# Patient Record
Sex: Female | Born: 1960 | Race: Black or African American | Hispanic: No | Marital: Married | Smoking: Former smoker
Health system: Southern US, Community
[De-identification: ages and names within clinical notes are randomized; demographics above are authoritative.]

## PROBLEM LIST (undated history)

## (undated) ENCOUNTER — Emergency Department (HOSPITAL_BASED_OUTPATIENT_CLINIC_OR_DEPARTMENT_OTHER): Payer: Self-pay | Source: Home / Self Care

## (undated) DIAGNOSIS — M1990 Unspecified osteoarthritis, unspecified site: Secondary | ICD-10-CM

## (undated) DIAGNOSIS — K219 Gastro-esophageal reflux disease without esophagitis: Secondary | ICD-10-CM

## (undated) DIAGNOSIS — J189 Pneumonia, unspecified organism: Secondary | ICD-10-CM

## (undated) DIAGNOSIS — I10 Essential (primary) hypertension: Secondary | ICD-10-CM

## (undated) DIAGNOSIS — M3500 Sicca syndrome, unspecified: Secondary | ICD-10-CM

## (undated) DIAGNOSIS — R0600 Dyspnea, unspecified: Secondary | ICD-10-CM

## (undated) HISTORY — PX: HAMMER TOE SURGERY: SHX385

## (undated) HISTORY — PX: ABDOMINAL HYSTERECTOMY: SHX81

## (undated) HISTORY — DX: Sjogren syndrome, unspecified: M35.00

## (undated) HISTORY — PX: BACK SURGERY: SHX140

## (undated) HISTORY — PX: BUNIONECTOMY: SHX129

## (undated) HISTORY — PX: SALPINGECTOMY: SHX328

## (undated) HISTORY — DX: Gastro-esophageal reflux disease without esophagitis: K21.9

## (undated) HISTORY — PX: ORTHOPEDIC SURGERY: SHX850

## (undated) HISTORY — DX: Unspecified osteoarthritis, unspecified site: M19.90

---
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
## 2005-09-05 ENCOUNTER — Encounter: Admission: RE | Admit: 2005-09-05 | Discharge: 2005-09-05 | Payer: Self-pay | Admitting: Internal Medicine

## 2010-11-18 ENCOUNTER — Emergency Department (HOSPITAL_BASED_OUTPATIENT_CLINIC_OR_DEPARTMENT_OTHER)
Admission: EM | Admit: 2010-11-18 | Discharge: 2010-11-18 | Disposition: A | Discharge status: Home / Self Care | Payer: PRIVATE HEALTH INSURANCE | Attending: Emergency Medicine | Admitting: Emergency Medicine

## 2010-11-18 ENCOUNTER — Emergency Department (INDEPENDENT_AMBULATORY_CARE_PROVIDER_SITE_OTHER): Payer: PRIVATE HEALTH INSURANCE

## 2010-11-18 DIAGNOSIS — I517 Cardiomegaly: Secondary | ICD-10-CM | POA: Insufficient documentation

## 2010-11-18 DIAGNOSIS — Z79899 Other long term (current) drug therapy: Secondary | ICD-10-CM | POA: Insufficient documentation

## 2010-11-18 DIAGNOSIS — R079 Chest pain, unspecified: Secondary | ICD-10-CM

## 2010-11-18 DIAGNOSIS — I10 Essential (primary) hypertension: Secondary | ICD-10-CM | POA: Insufficient documentation

## 2010-11-18 DIAGNOSIS — K219 Gastro-esophageal reflux disease without esophagitis: Secondary | ICD-10-CM | POA: Insufficient documentation

## 2010-11-18 LAB — BASIC METABOLIC PANEL
BUN: 22 mg/dL (ref 6–23)
CO2: 27 mEq/L (ref 19–32)
Calcium: 9.8 mg/dL (ref 8.4–10.5)
Chloride: 104 mEq/L (ref 96–112)
Creatinine, Ser: 0.7 mg/dL (ref 0.4–1.2)
GFR calc Af Amer: >60 mL/min (ref >60)
GFR calc non Af Amer: >60 mL/min (ref >60)
Glucose, Bld: 91 mg/dL (ref 70–99)
Potassium: 3.9 mEq/L (ref 3.5–5.1)
Sodium: 142 mEq/L (ref 135–145)

## 2010-11-18 LAB — DIFFERENTIAL
Basophils Absolute: 0 10*3/uL (ref 0.0–0.1)
Basophils Relative: 0 % (ref 0–1)
Eosinophils Absolute: 0.1 10*3/uL (ref 0.0–0.7)
Eosinophils Relative: 1 % (ref 0–5)
Lymphocytes Relative: 46 % (ref 12–46)
Lymphs Abs: 3.3 10*3/uL (ref 0.7–4.0)
Monocytes Absolute: 0.6 10*3/uL (ref 0.1–1.0)
Monocytes Relative: 9 % (ref 3–12)
Neutro Abs: 3.2 10*3/uL (ref 1.7–7.7)
Neutrophils Relative %: 44 % (ref 43–77)

## 2010-11-18 LAB — POCT CARDIAC MARKERS
CKMB, poc: <1 ng/mL — ABNORMAL LOW (ref 1.0–8.0)
Myoglobin, poc: 39 ng/mL (ref 12–200)
Troponin i, poc: <0.05 ng/mL (ref 0.00–0.09)

## 2010-11-18 LAB — CBC
HCT: 37.1 % (ref 36.0–46.0)
Hemoglobin: 12.4 g/dL (ref 12.0–15.0)
MCH: 23.3 pg — ABNORMAL LOW (ref 26.0–34.0)
MCHC: 33.4 g/dL (ref 30.0–36.0)
MCV: 69.7 fL — ABNORMAL LOW (ref 78.0–100.0)
Platelets: 244 10*3/uL (ref 150–400)
RBC: 5.32 MIL/uL — ABNORMAL HIGH (ref 3.87–5.11)
RDW: 14.6 % (ref 11.5–15.5)
WBC: 7.2 10*3/uL (ref 4.0–10.5)

## 2014-01-10 ENCOUNTER — Encounter (HOSPITAL_BASED_OUTPATIENT_CLINIC_OR_DEPARTMENT_OTHER): Payer: Self-pay | Admitting: Emergency Medicine

## 2014-01-10 DIAGNOSIS — R05 Cough: Secondary | ICD-10-CM | POA: Insufficient documentation

## 2014-01-10 DIAGNOSIS — R059 Cough, unspecified: Secondary | ICD-10-CM | POA: Insufficient documentation

## 2014-01-10 DIAGNOSIS — Z87891 Personal history of nicotine dependence: Secondary | ICD-10-CM | POA: Insufficient documentation

## 2014-01-10 DIAGNOSIS — I10 Essential (primary) hypertension: Secondary | ICD-10-CM | POA: Insufficient documentation

## 2014-01-10 DIAGNOSIS — Z79899 Other long term (current) drug therapy: Secondary | ICD-10-CM | POA: Insufficient documentation

## 2014-01-10 NOTE — ED Notes (Signed)
States noticed a small area of swellinf rt side of neck    No pain

## 2014-01-11 ENCOUNTER — Emergency Department (HOSPITAL_BASED_OUTPATIENT_CLINIC_OR_DEPARTMENT_OTHER): Payer: PRIVATE HEALTH INSURANCE

## 2014-01-11 ENCOUNTER — Emergency Department (HOSPITAL_BASED_OUTPATIENT_CLINIC_OR_DEPARTMENT_OTHER)
Admission: EM | Admit: 2014-01-11 | Discharge: 2014-01-11 | Disposition: A | Discharge status: Home / Self Care | Payer: PRIVATE HEALTH INSURANCE | Attending: Emergency Medicine | Admitting: Emergency Medicine

## 2014-01-11 DIAGNOSIS — I10 Essential (primary) hypertension: Secondary | ICD-10-CM

## 2014-01-11 HISTORY — DX: Essential (primary) hypertension: I10

## 2014-01-11 NOTE — ED Provider Notes (Signed)
CSN: [id number]     Arrival date & time 01/10/14  2328 History   First MD Initiated Contact with Patient 01/11/14 0251     Chief Complaint  Patient presents with  . Facial Swelling     (Consider location/radiation/quality/duration/timing/severity/associated sxs/prior Treatment) HPI This is a 53 year old female who noticed swelling of the right anterolateral neck yesterday. She has never noticed this before. The swelling is worse with Valsalva maneuver. There is no pain or tenderness associated with it. She has had no chest pain or shortness of breath. She has a chronic cough for as long as she can remember, in which she attributes to postnasal drip. She has a history of ankle edema for which she has been treated with Lasix in the past. She is currently on hydrochlorothiazide for hypertension.  Past Medical History  Diagnosis Date  . Hypertension    Past Surgical History  Procedure Laterality Date  . Orthopedic surgery    . Abdominal hysterectomy     No family history on file. History  Substance Use Topics  . Smoking status: Former Games developer  . Smokeless tobacco: Not on file  . Alcohol Use: Yes   OB History   Grav Para Term Preterm Abortions TAB SAB Ect Mult Living                 Review of Systems  All other systems reviewed and are negative.  Allergies  Sulfa antibiotics and Tetracyclines & related  Home Medications   Current Outpatient Rx  Name  Route  Sig  Dispense  Refill  . hydrochlorothiazide (HYDRODIURIL) 25 MG tablet   Oral   Take 25 mg by mouth daily.          BP 170/117  Pulse 66  Temp(Src) 98.2 F (36.8 C) (Oral)  Resp 22  Ht 5' 2.75" (1.594 m)  Wt 230 lb (104.327 kg)  BMI 41.06 kg/m2  SpO2 98%  Physical Exam General: Well-developed, well-nourished female in no acute distress; appearance consistent with age of record HENT: normocephalic; atraumatic; normal pharynx Eyes: pupils equal, round and reactive to light; extraocular muscles intact Neck:  supple; nontender; jugular venous pulsations noted, more prominent with Valsalva maneuver Heart: regular rate and rhythm; no murmurs, rubs or gallops Lungs: clear to auscultation bilaterally Abdomen: soft; nondistended; mild right upper quadrant tenderness; no masses or hepatosplenomegaly; bowel sounds present; no hepatojugular reflex Extremities: No deformity; full range of motion; pulses normal; trace edema of ankles Neurologic: Awake, alert and oriented; motor function intact in all extremities and symmetric; no facial droop Skin: Warm and dry Psychiatric: Normal mood and affect    ED Course  Procedures (including critical care time)    MDM  Nursing notes and vitals signs, including pulse oximetry, reviewed.  Summary of this visit's results, reviewed by myself:  Labs:  The patient did not wish to wait for lab results and will followup with her primary care physician for followup evaluation.  Imaging Studies: Dg Chest 2 View  01/11/2014   CLINICAL DATA:  Cough  EXAM: CHEST  2 VIEW  COMPARISON:  11/18/2010  FINDINGS: Chronic mild cardiomegaly. Unchanged aortic contours. No edema or consolidation. No effusion or pneumothorax. Negative osseous structures.  IMPRESSION: No active cardiopulmonary disease.   Electronically Signed   By: Tiburcio Pea M.D.   On: 01/11/2014 04:08   No evidence of right-sided heart failure was found on exam or x-ray. The patient's blood pressure which was initially 170/117 has improved. She was advised of her  blood pressure and she will follow up with her primary care physician.    Hanley SeamenJohn L Damiana Berrian, MD 01/11/14 [phone]

## 2014-01-11 NOTE — Discharge Instructions (Signed)

## 2014-01-11 NOTE — ED Notes (Signed)
[facility] [phone]) Med rec was faxed. Unable to scan med rec req due to no scan access

## 2014-01-11 NOTE — ED Notes (Signed)
MD at bedside. 

## 2014-01-11 NOTE — ED Notes (Signed)
Patient transported to X-ray 

## 2014-01-11 NOTE — ED Notes (Signed)
Pt states that she noted her right side of her neck was swollen this afternoon and decided to have it evaluated, denies sob, denies difficulty swallowing. + cough secondary to allergies, pt did state that she was on a "ccb" in the past but it was discontinued secondary to a chronic cough. The cough cleared up once med was stopped. Pt stated that she is now on an "arb" for htn now and has had not difficulties. No edema noted to visible eye.

## 2016-02-27 DIAGNOSIS — E559 Vitamin D deficiency, unspecified: Secondary | ICD-10-CM

## 2016-02-27 HISTORY — DX: Vitamin D deficiency, unspecified: E55.9

## 2018-01-10 DIAGNOSIS — M5416 Radiculopathy, lumbar region: Secondary | ICD-10-CM | POA: Insufficient documentation

## 2018-01-10 HISTORY — DX: Radiculopathy, lumbar region: M54.16

## 2018-08-02 DIAGNOSIS — F4323 Adjustment disorder with mixed anxiety and depressed mood: Secondary | ICD-10-CM | POA: Insufficient documentation

## 2018-08-02 HISTORY — DX: Adjustment disorder with mixed anxiety and depressed mood: F43.23

## 2019-05-18 DIAGNOSIS — I272 Pulmonary hypertension, unspecified: Secondary | ICD-10-CM | POA: Insufficient documentation

## 2019-05-18 DIAGNOSIS — G250 Essential tremor: Secondary | ICD-10-CM | POA: Insufficient documentation

## 2019-05-18 DIAGNOSIS — R718 Other abnormality of red blood cells: Secondary | ICD-10-CM | POA: Insufficient documentation

## 2019-05-18 HISTORY — DX: Other abnormality of red blood cells: R71.8

## 2019-05-18 HISTORY — DX: Essential tremor: G25.0

## 2019-05-18 HISTORY — DX: Pulmonary hypertension, unspecified: I27.20

## 2019-07-16 DIAGNOSIS — M542 Cervicalgia: Secondary | ICD-10-CM

## 2019-07-16 DIAGNOSIS — M48061 Spinal stenosis, lumbar region without neurogenic claudication: Secondary | ICD-10-CM

## 2019-07-16 HISTORY — DX: Cervicalgia: M54.2

## 2019-07-16 HISTORY — DX: Spinal stenosis, lumbar region without neurogenic claudication: M48.061

## 2019-07-18 DIAGNOSIS — K219 Gastro-esophageal reflux disease without esophagitis: Secondary | ICD-10-CM

## 2019-07-18 HISTORY — DX: Gastro-esophageal reflux disease without esophagitis: K21.9

## 2020-05-22 DIAGNOSIS — Z9189 Other specified personal risk factors, not elsewhere classified: Secondary | ICD-10-CM

## 2020-05-22 DIAGNOSIS — M3500 Sicca syndrome, unspecified: Secondary | ICD-10-CM | POA: Insufficient documentation

## 2020-05-22 HISTORY — DX: Other specified personal risk factors, not elsewhere classified: Z91.89

## 2020-06-01 ENCOUNTER — Encounter (HOSPITAL_BASED_OUTPATIENT_CLINIC_OR_DEPARTMENT_OTHER): Payer: Self-pay | Admitting: Emergency Medicine

## 2020-06-01 ENCOUNTER — Other Ambulatory Visit: Payer: Self-pay

## 2020-06-01 ENCOUNTER — Inpatient Hospital Stay (HOSPITAL_BASED_OUTPATIENT_CLINIC_OR_DEPARTMENT_OTHER)
Admission: EM | Admit: 2020-06-01 | Discharge: 2020-06-05 | DRG: 917 | Disposition: A | Discharge status: Home / Self Care | Payer: BC Managed Care – PPO | Attending: Student | Admitting: Student

## 2020-06-01 ENCOUNTER — Emergency Department (HOSPITAL_BASED_OUTPATIENT_CLINIC_OR_DEPARTMENT_OTHER): Payer: BC Managed Care – PPO

## 2020-06-01 DIAGNOSIS — F1210 Cannabis abuse, uncomplicated: Secondary | ICD-10-CM | POA: Diagnosis present

## 2020-06-01 DIAGNOSIS — Z882 Allergy status to sulfonamides status: Secondary | ICD-10-CM

## 2020-06-01 DIAGNOSIS — F419 Anxiety disorder, unspecified: Secondary | ICD-10-CM | POA: Diagnosis present

## 2020-06-01 DIAGNOSIS — Z79899 Other long term (current) drug therapy: Secondary | ICD-10-CM

## 2020-06-01 DIAGNOSIS — E876 Hypokalemia: Secondary | ICD-10-CM | POA: Diagnosis not present

## 2020-06-01 DIAGNOSIS — G834 Cauda equina syndrome: Secondary | ICD-10-CM | POA: Diagnosis present

## 2020-06-01 DIAGNOSIS — F329 Major depressive disorder, single episode, unspecified: Secondary | ICD-10-CM | POA: Diagnosis present

## 2020-06-01 DIAGNOSIS — G9341 Metabolic encephalopathy: Secondary | ICD-10-CM | POA: Diagnosis present

## 2020-06-01 DIAGNOSIS — T407X1A Poisoning by cannabis (derivatives), accidental (unintentional), initial encounter: Secondary | ICD-10-CM | POA: Diagnosis not present

## 2020-06-01 DIAGNOSIS — Z6832 Body mass index (BMI) 32.0-32.9, adult: Secondary | ICD-10-CM

## 2020-06-01 DIAGNOSIS — Z881 Allergy status to other antibiotic agents status: Secondary | ICD-10-CM

## 2020-06-01 DIAGNOSIS — G9340 Encephalopathy, unspecified: Secondary | ICD-10-CM

## 2020-06-01 DIAGNOSIS — Z87891 Personal history of nicotine dependence: Secondary | ICD-10-CM

## 2020-06-01 DIAGNOSIS — R112 Nausea with vomiting, unspecified: Secondary | ICD-10-CM

## 2020-06-01 DIAGNOSIS — E871 Hypo-osmolality and hyponatremia: Secondary | ICD-10-CM | POA: Diagnosis present

## 2020-06-01 DIAGNOSIS — R1110 Vomiting, unspecified: Secondary | ICD-10-CM

## 2020-06-01 DIAGNOSIS — T502X5A Adverse effect of carbonic-anhydrase inhibitors, benzothiadiazides and other diuretics, initial encounter: Secondary | ICD-10-CM | POA: Diagnosis present

## 2020-06-01 DIAGNOSIS — E669 Obesity, unspecified: Secondary | ICD-10-CM | POA: Diagnosis present

## 2020-06-01 DIAGNOSIS — Z20822 Contact with and (suspected) exposure to covid-19: Secondary | ICD-10-CM | POA: Diagnosis present

## 2020-06-01 DIAGNOSIS — Z90710 Acquired absence of both cervix and uterus: Secondary | ICD-10-CM

## 2020-06-01 DIAGNOSIS — I10 Essential (primary) hypertension: Secondary | ICD-10-CM | POA: Diagnosis present

## 2020-06-01 DIAGNOSIS — R9431 Abnormal electrocardiogram [ECG] [EKG]: Secondary | ICD-10-CM | POA: Diagnosis present

## 2020-06-01 DIAGNOSIS — G92 Toxic encephalopathy: Secondary | ICD-10-CM | POA: Diagnosis present

## 2020-06-01 HISTORY — DX: Encephalopathy, unspecified: G93.40

## 2020-06-01 LAB — URINALYSIS, ROUTINE W REFLEX MICROSCOPIC
Bilirubin Urine: NEGATIVE
Glucose, UA: NEGATIVE mg/dL
Ketones, ur: 40 mg/dL — AB
Leukocytes,Ua: NEGATIVE
Nitrite: NEGATIVE
Protein, ur: 100 mg/dL — AB
Specific Gravity, Urine: 1.025 (ref 1.005–1.030)
pH: 7.5 (ref 5.0–8.0)

## 2020-06-01 LAB — URINALYSIS, MICROSCOPIC (REFLEX): Bacteria, UA: NONE SEEN

## 2020-06-01 LAB — COMPREHENSIVE METABOLIC PANEL
ALT: 18 U/L (ref 0–44)
AST: 29 U/L (ref 15–41)
Albumin: 4.6 g/dL (ref 3.5–5.0)
Alkaline Phosphatase: 83 U/L (ref 38–126)
Anion gap: 16 — ABNORMAL HIGH (ref 5–15)
BUN: 8 mg/dL (ref 6–20)
CO2: 24 mmol/L (ref 22–32)
Calcium: 10.2 mg/dL (ref 8.9–10.3)
Chloride: 97 mmol/L — ABNORMAL LOW (ref 98–111)
Creatinine, Ser: 0.6 mg/dL (ref 0.44–1.00)
GFR calc Af Amer: >60 mL/min (ref >60)
GFR calc non Af Amer: >60 mL/min (ref >60)
Glucose, Bld: 143 mg/dL — ABNORMAL HIGH (ref 70–99)
Potassium: 2.7 mmol/L — CL (ref 3.5–5.1)
Sodium: 137 mmol/L (ref 135–145)
Total Bilirubin: 0.8 mg/dL (ref 0.3–1.2)
Total Protein: 8.3 g/dL — ABNORMAL HIGH (ref 6.5–8.1)

## 2020-06-01 LAB — RAPID URINE DRUG SCREEN, HOSP PERFORMED
Amphetamines: NOT DETECTED
Barbiturates: NOT DETECTED
Benzodiazepines: NOT DETECTED
Cocaine: NOT DETECTED
Opiates: NOT DETECTED
Tetrahydrocannabinol: POSITIVE — AB

## 2020-06-01 LAB — AMMONIA: Ammonia: 23 umol/L (ref 9–35)

## 2020-06-01 LAB — CBC
HCT: 43.8 % (ref 36.0–46.0)
Hemoglobin: 13.8 g/dL (ref 12.0–15.0)
MCH: 23.4 pg — ABNORMAL LOW (ref 26.0–34.0)
MCHC: 31.5 g/dL (ref 30.0–36.0)
MCV: 74.4 fL — ABNORMAL LOW (ref 80.0–100.0)
Platelets: 257 10*3/uL (ref 150–400)
RBC: 5.89 MIL/uL — ABNORMAL HIGH (ref 3.87–5.11)
RDW: 14.6 % (ref 11.5–15.5)
WBC: 8.7 10*3/uL (ref 4.0–10.5)
nRBC: 0 % (ref 0.0–0.2)

## 2020-06-01 LAB — PREGNANCY, URINE: Preg Test, Ur: NEGATIVE

## 2020-06-01 LAB — LIPASE, BLOOD: Lipase: 21 U/L (ref 11–51)

## 2020-06-01 LAB — SARS CORONAVIRUS 2 BY RT PCR (HOSPITAL ORDER, PERFORMED IN ~~LOC~~ HOSPITAL LAB): SARS Coronavirus 2: NEGATIVE

## 2020-06-01 LAB — MAGNESIUM: Magnesium: 1.3 mg/dL — ABNORMAL LOW (ref 1.7–2.4)

## 2020-06-01 MED ORDER — SODIUM CHLORIDE 0.9 % IV SOLN: INTRAVENOUS | Status: DC | PRN

## 2020-06-01 MED ORDER — LORAZEPAM 2 MG/ML IJ SOLN
1.0000 mg | Freq: Once | INTRAMUSCULAR | Status: AC
  Administered 2020-06-01: 1 mg via INTRAVENOUS
  Filled 2020-06-01: qty 1

## 2020-06-01 MED ORDER — MAGNESIUM OXIDE 400 (241.3 MG) MG PO TABS: 400.0000 mg | ORAL_TABLET | Freq: Once | ORAL | Status: DC

## 2020-06-01 MED ORDER — ONDANSETRON HCL 4 MG/2ML IJ SOLN
4.0000 mg | Freq: Once | INTRAMUSCULAR | Status: AC
  Administered 2020-06-01: 4 mg via INTRAVENOUS
  Filled 2020-06-01: qty 2

## 2020-06-01 MED ORDER — SODIUM CHLORIDE 0.9 % IV BOLUS
1000.0000 mL | Freq: Once | INTRAVENOUS | Status: AC
  Administered 2020-06-01: 1000 mL via INTRAVENOUS

## 2020-06-01 MED ORDER — POTASSIUM CHLORIDE 10 MEQ/100ML IV SOLN
10.0000 meq | INTRAVENOUS | Status: AC
  Administered 2020-06-01 (×2): 10 meq via INTRAVENOUS
  Filled 2020-06-01 (×4): qty 100

## 2020-06-01 MED ORDER — MAGNESIUM SULFATE 2 GM/50ML IV SOLN
2.0000 g | Freq: Once | INTRAVENOUS | Status: AC
  Administered 2020-06-01: 2 g via INTRAVENOUS
  Filled 2020-06-01: qty 50

## 2020-06-01 MED ORDER — POTASSIUM CHLORIDE CRYS ER 20 MEQ PO TBCR
40.0000 meq | EXTENDED_RELEASE_TABLET | Freq: Once | ORAL | Status: DC
  Filled 2020-06-01: qty 2

## 2020-06-01 NOTE — ED Notes (Signed)
Pt very restless and talking to herself. Pt able to answer yes/no questions and is oriented to self and place. Pt disoriented to time and events.

## 2020-06-01 NOTE — ED Notes (Signed)
Not ready for xray, RN to call

## 2020-06-01 NOTE — ED Provider Notes (Signed)
[facility] EMERGENCY DEPARTMENT Provider Note   CSN: [id number] Arrival date & time: 06/01/20  1700     History Chief Complaint  Patient presents with  . Abdominal Pain    Mikayla Hart is a 59 y.o. female.  Patient with history of GERD, hypertension, depression presents to the emergency department for nausea, vomiting, and generalized abdominal pain.  She also presents with altered mental status.  Patient had lumbar microdiscectomy performed 2 days ago as outpatient surgery at [facility] ED.  She tolerated this procedure well.  Per husband she was in normal state of health yesterday.  Around noon today she developed nausea and vomiting.  She used a Phenergan suppository.  About 2 PM she started "talking out of her head".  She was confused, restless, talking to herself.  She was saying things that they make any sense.  She had multiple episodes of vomiting.  Patient was prescribed pain medication after surgery but states that she has not taken any.  She denies alcohol or other drugs to me.  No previous abdominal surgeries.  She was transported here by EMS.  Patient denies signs of stroke including: facial droop, slurred speech, aphasia, weakness/numbness in extremities, imbalance/trouble walking.         Past Medical History:  Diagnosis Date  . Hypertension     There are no problems to display for this patient.   Past Surgical History:  Procedure Laterality Date  . ABDOMINAL HYSTERECTOMY    . ORTHOPEDIC SURGERY       OB History   No obstetric history on file.     No family history on file.  Social History   Tobacco Use  . Smoking status: Former Smoker  Substance Use Topics  . Alcohol use: Yes  . Drug use: No    Home Medications Prior to Admission medications   Medication Sig Start Date End Date Taking? Authorizing Provider  hydrochlorothiazide (HYDRODIURIL) 25 MG tablet Take 25 mg by mouth daily.    [provider]    Allergies    Sulfa  antibiotics and Tetracyclines & related  Review of Systems   Review of Systems  Constitutional: Negative for chills and fever.  HENT: Negative for rhinorrhea and sore throat.   Eyes: Negative for redness.  Respiratory: Negative for cough and shortness of breath.   Cardiovascular: Negative for chest pain.  Gastrointestinal: Positive for abdominal pain, nausea and vomiting. Negative for diarrhea.  Genitourinary: Negative for dysuria, frequency, hematuria and urgency.  Musculoskeletal: Negative for back pain and myalgias.  Skin: Negative for rash.  Neurological: Negative for headaches.  Psychiatric/Behavioral: Positive for confusion.    Physical Exam Updated Vital Signs BP (!) 149/101 (BP Location: Right Arm)   Pulse 69   Temp 98 F (36.7 C) (Oral)   Resp 18   Ht 5\' 2"  (1.575 m)   Wt 81.6 kg   SpO2 100%   BMI 32.92 kg/m   Physical Exam Vitals and nursing note reviewed.  Constitutional:      General: She is not in acute distress.    Appearance: She is well-developed.  HENT:     Head: Normocephalic and atraumatic.     Right Ear: Tympanic membrane, ear canal and external ear normal.     Left Ear: Tympanic membrane, ear canal and external ear normal.     Nose: Nose normal.     Mouth/Throat:     Pharynx: Uvula midline.  Eyes:     General: Lids are normal.  Extraocular Movements:     Right eye: No nystagmus.     Left eye: No nystagmus.     Conjunctiva/sclera: Conjunctivae normal.     Pupils: Pupils are equal, round, and reactive to light.  Cardiovascular:     Rate and Rhythm: Normal rate and regular rhythm.     Heart sounds: No murmur heard.   Pulmonary:     Effort: Pulmonary effort is normal. No respiratory distress.     Breath sounds: Normal breath sounds. No wheezing, rhonchi or rales.  Abdominal:     Palpations: Abdomen is soft.     Tenderness: There is no abdominal tenderness. There is no guarding or rebound.  Musculoskeletal:     Cervical back: Normal range  of motion and neck supple. No tenderness or bony tenderness.     Right lower leg: No edema.     Left lower leg: No edema.     Comments: Surgical wound evaluated.  Wound is clean and dry without surrounding erythema.  No drainage.  Wound is closed with tissue adhesive.  Skin:    General: Skin is warm and dry.     Findings: No rash.  Neurological:     General: No focal deficit present.     Mental Status: She is alert. Mental status is at baseline. She is disoriented.     GCS: GCS eye subscore is 4. GCS verbal subscore is 5. GCS motor subscore is 6.     Cranial Nerves: No cranial nerve deficit.     Sensory: No sensory deficit.     Motor: No weakness.     Coordination: Coordination normal.     Gait: Gait normal.     Comments: Follows basic commands. Cannot tell me where she had her surgery done. She has rambling speech at times which doesn't make sense.  Psychiatric:        Mood and Affect: Mood normal.     ED Results / Procedures / Treatments   Labs (all labs ordered are listed, but only abnormal results are displayed) Labs Reviewed  COMPREHENSIVE METABOLIC PANEL - Abnormal; Notable for the following components:      Result Value   Potassium 2.7 (*)    Chloride 97 (*)    Glucose, Bld 143 (*)    Total Protein 8.3 (*)    Anion gap 16 (*)    All other components within normal limits  CBC - Abnormal; Notable for the following components:   RBC 5.89 (*)    MCV 74.4 (*)    MCH 23.4 (*)    All other components within normal limits  URINALYSIS, ROUTINE W REFLEX MICROSCOPIC - Abnormal; Notable for the following components:   Hgb urine dipstick MODERATE (*)    Ketones, ur 40 (*)    Protein, ur 100 (*)    All other components within normal limits  MAGNESIUM - Abnormal; Notable for the following components:   Magnesium 1.3 (*)    All other components within normal limits  RAPID URINE DRUG SCREEN, HOSP PERFORMED - Abnormal; Notable for the following components:   Tetrahydrocannabinol  POSITIVE (*)    All other components within normal limits  SARS CORONAVIRUS 2 BY RT PCR (HOSPITAL ORDER, PERFORMED IN  HOSPITAL LAB)  LIPASE, BLOOD  PREGNANCY, URINE  AMMONIA  URINALYSIS, MICROSCOPIC (REFLEX)    EKG None  Radiology DG Chest 2 View  Result Date: 06/01/2020 CLINICAL DATA:  Generalized abdominal pain and vomiting. EXAM: CHEST - 2 VIEW COMPARISON:  May 11, 2019 FINDINGS: There is no evidence of acute infiltrate, pleural effusion or pneumothorax. The heart size and mediastinal contours are within normal limits. The visualized skeletal structures are unremarkable. IMPRESSION: No active cardiopulmonary disease. Electronically Signed   By: Aram Candelahaddeus  Houston M.D.   On: 06/01/2020 20:19   CT Head Wo Contrast  Result Date: 06/01/2020 CLINICAL DATA:  Mental status change, unknown cause Patient reports back surgery 2 days ago. EXAM: CT HEAD WITHOUT CONTRAST TECHNIQUE: Contiguous axial images were obtained from the base of the skull through the vertex without intravenous contrast. COMPARISON:  Head CT 07/06/2016 FINDINGS: Brain: Brain volume is normal for age. No intracranial hemorrhage, mass effect, or midline shift. No hydrocephalus. The basilar cisterns are patent. No evidence of territorial infarct or acute ischemia. No extra-axial or intracranial fluid collection. Vascular: No hyperdense vessel or unexpected calcification. Skull: No fracture or focal lesion. Sinuses/Orbits: Frontal sinuses are hypo pneumatized. No acute findings. Mastoid air cells are clear. Included orbits are unremarkable. Other: None. IMPRESSION: Negative noncontrast head CT. Electronically Signed   By: Narda RutherfordMelanie  Sanford M.D.   On: 06/01/2020 22:41    Procedures Procedures (including critical care time)  Medications Ordered in ED Medications  potassium chloride SA (KLOR-CON) CR tablet 40 mEq (40 mEq Oral Not Given 06/01/20 1944)  potassium chloride 10 mEq in 100 mL IVPB (10 mEq Intravenous New  Bag/Given 06/01/20 2327)  0.9 %  sodium chloride infusion ( Intravenous New Bag/Given 06/01/20 2033)  magnesium sulfate IVPB 2 g 50 mL (2 g Intravenous New Bag/Given 06/01/20 2338)  0.9 %  sodium chloride infusion (has no administration in time range)  sodium chloride 0.9 % bolus 1,000 mL ( Intravenous Stopped 06/01/20 2053)  ondansetron (ZOFRAN) injection 4 mg (4 mg Intravenous Given 06/01/20 1942)  LORazepam (ATIVAN) injection 1 mg (1 mg Intravenous Given 06/01/20 1941)    ED Course  I have reviewed the triage vital signs and the nursing notes.  Pertinent labs & imaging results that were available during my care of the patient were reviewed by me and considered in my medical decision making (see chart for details).  Patient seen and examined. Work-up initiated. Medications ordered.    Vital signs reviewed and are as follows: BP (!) 149/101 (BP Location: Right Arm)   Pulse 69   Temp 98 F (36.7 C) (Oral)   Resp 18   Ht 5\' 2"  (1.575 m)   Wt 81.6 kg   SpO2 100%   BMI 32.92 kg/m   K and mag low. Repletion ordered. Pt was given a dose of Ativan earlier due to extreme restlessness.  I went to recheck the patient.  She was sleeping.  She continues to be confused with inappropriate speech.  Husband agrees that she is not back to prior baseline.  Patient discussed with Dr. Particia NearingHaviland.  Head CT ordered.  Head CT normal.  As patient is now back to her baseline and has electrolyte abnormalities, plan to admit to the hospital for observation.  Will speak with hospitalist and request admission.  Patient is Covid negative.  UDS positive for THC.  11:49 PM Spoke with Dr. Antionette Charpyd who accepts for admission.     MDM Rules/Calculators/A&P                          Admit for encephalopathy and electrolyte disturbances.    Final Clinical Impression(s) / ED Diagnoses Final diagnoses:  Hypokalemia  Hypomagnesemia  Non-intractable vomiting with nausea,  unspecified vomiting type  Encephalopathy    Rx  / DC Orders ED Discharge Orders    None       Renne Crigler, Cordelia Poche 06/01/20 2350    Jacalyn Lefevre, MD 06/04/20 [phone]

## 2020-06-01 NOTE — ED Notes (Signed)
Date and time results received: 06/01/20 1757   Test:potassium Critical Value: 2.7  Name of Provider Notified: Haviland Orders Received? Or Actions Taken?:no orders given

## 2020-06-01 NOTE — ED Triage Notes (Signed)
Brought by ems from home.  Reports she had back surgery on Friday.  Was fine the first few days.  Today started having generalized abdominal pain and vomiting.  Used phenergan 12.5mg  supp around 1230 today.  Has not taken any of the pain meds she was prescribed.

## 2020-06-02 ENCOUNTER — Observation Stay (HOSPITAL_COMMUNITY): Payer: BC Managed Care – PPO

## 2020-06-02 ENCOUNTER — Encounter (HOSPITAL_COMMUNITY): Payer: Self-pay | Admitting: Internal Medicine

## 2020-06-02 DIAGNOSIS — E871 Hypo-osmolality and hyponatremia: Secondary | ICD-10-CM | POA: Diagnosis present

## 2020-06-02 DIAGNOSIS — G9340 Encephalopathy, unspecified: Secondary | ICD-10-CM | POA: Diagnosis not present

## 2020-06-02 DIAGNOSIS — G92 Toxic encephalopathy: Secondary | ICD-10-CM | POA: Diagnosis present

## 2020-06-02 DIAGNOSIS — R1110 Vomiting, unspecified: Secondary | ICD-10-CM

## 2020-06-02 DIAGNOSIS — F1210 Cannabis abuse, uncomplicated: Secondary | ICD-10-CM | POA: Diagnosis present

## 2020-06-02 DIAGNOSIS — G9341 Metabolic encephalopathy: Secondary | ICD-10-CM | POA: Diagnosis present

## 2020-06-02 DIAGNOSIS — I10 Essential (primary) hypertension: Secondary | ICD-10-CM | POA: Diagnosis present

## 2020-06-02 DIAGNOSIS — Z20822 Contact with and (suspected) exposure to covid-19: Secondary | ICD-10-CM | POA: Diagnosis present

## 2020-06-02 DIAGNOSIS — Z6832 Body mass index (BMI) 32.0-32.9, adult: Secondary | ICD-10-CM | POA: Diagnosis not present

## 2020-06-02 DIAGNOSIS — Z79899 Other long term (current) drug therapy: Secondary | ICD-10-CM | POA: Diagnosis not present

## 2020-06-02 DIAGNOSIS — Z90710 Acquired absence of both cervix and uterus: Secondary | ICD-10-CM | POA: Diagnosis not present

## 2020-06-02 DIAGNOSIS — E669 Obesity, unspecified: Secondary | ICD-10-CM | POA: Diagnosis present

## 2020-06-02 DIAGNOSIS — R9431 Abnormal electrocardiogram [ECG] [EKG]: Secondary | ICD-10-CM | POA: Diagnosis present

## 2020-06-02 DIAGNOSIS — Z882 Allergy status to sulfonamides status: Secondary | ICD-10-CM | POA: Diagnosis not present

## 2020-06-02 DIAGNOSIS — Z87891 Personal history of nicotine dependence: Secondary | ICD-10-CM | POA: Diagnosis not present

## 2020-06-02 DIAGNOSIS — F419 Anxiety disorder, unspecified: Secondary | ICD-10-CM | POA: Diagnosis present

## 2020-06-02 DIAGNOSIS — E876 Hypokalemia: Secondary | ICD-10-CM

## 2020-06-02 DIAGNOSIS — Z881 Allergy status to other antibiotic agents status: Secondary | ICD-10-CM | POA: Diagnosis not present

## 2020-06-02 DIAGNOSIS — F329 Major depressive disorder, single episode, unspecified: Secondary | ICD-10-CM | POA: Diagnosis present

## 2020-06-02 DIAGNOSIS — G834 Cauda equina syndrome: Secondary | ICD-10-CM | POA: Diagnosis present

## 2020-06-02 DIAGNOSIS — R112 Nausea with vomiting, unspecified: Secondary | ICD-10-CM | POA: Diagnosis present

## 2020-06-02 DIAGNOSIS — T502X5A Adverse effect of carbonic-anhydrase inhibitors, benzothiadiazides and other diuretics, initial encounter: Secondary | ICD-10-CM | POA: Diagnosis present

## 2020-06-02 DIAGNOSIS — T407X1A Poisoning by cannabis (derivatives), accidental (unintentional), initial encounter: Secondary | ICD-10-CM | POA: Diagnosis present

## 2020-06-02 HISTORY — DX: Metabolic encephalopathy: G93.41

## 2020-06-02 HISTORY — DX: Hypokalemia: E87.6

## 2020-06-02 HISTORY — DX: Vomiting, unspecified: R11.10

## 2020-06-02 HISTORY — DX: Essential (primary) hypertension: I10

## 2020-06-02 HISTORY — DX: Hypomagnesemia: E83.42

## 2020-06-02 LAB — BASIC METABOLIC PANEL
Anion gap: 12 (ref 5–15)
BUN: 8 mg/dL (ref 6–20)
CO2: 24 mmol/L (ref 22–32)
Calcium: 9.3 mg/dL (ref 8.9–10.3)
Chloride: 99 mmol/L (ref 98–111)
Creatinine, Ser: 0.61 mg/dL (ref 0.44–1.00)
GFR calc Af Amer: >60 mL/min (ref >60)
GFR calc non Af Amer: >60 mL/min (ref >60)
Glucose, Bld: 104 mg/dL — ABNORMAL HIGH (ref 70–99)
Potassium: 3.1 mmol/L — ABNORMAL LOW (ref 3.5–5.1)
Sodium: 135 mmol/L (ref 135–145)

## 2020-06-02 LAB — CBC WITH DIFFERENTIAL/PLATELET
Abs Immature Granulocytes: 0.05 10*3/uL (ref 0.00–0.07)
Abs Immature Granulocytes: 0.06 10*3/uL (ref 0.00–0.07)
Basophils Absolute: 0 10*3/uL (ref 0.0–0.1)
Basophils Absolute: 0 10*3/uL (ref 0.0–0.1)
Basophils Relative: 0 %
Basophils Relative: 0 %
Eosinophils Absolute: 0 10*3/uL (ref 0.0–0.5)
Eosinophils Absolute: 0 10*3/uL (ref 0.0–0.5)
Eosinophils Relative: 0 %
Eosinophils Relative: 0 %
HCT: 40.4 % (ref 36.0–46.0)
HCT: 41.6 % (ref 36.0–46.0)
Hemoglobin: 13 g/dL (ref 12.0–15.0)
Hemoglobin: 13.6 g/dL (ref 12.0–15.0)
Immature Granulocytes: 1 %
Immature Granulocytes: 1 %
Lymphocytes Relative: 22 %
Lymphocytes Relative: 20 %
Lymphs Abs: 2.3 10*3/uL (ref 0.7–4.0)
Lymphs Abs: 2.1 10*3/uL (ref 0.7–4.0)
MCH: 23.7 pg — ABNORMAL LOW (ref 26.0–34.0)
MCH: 24 pg — ABNORMAL LOW (ref 26.0–34.0)
MCHC: 32.2 g/dL (ref 30.0–36.0)
MCHC: 32.7 g/dL (ref 30.0–36.0)
MCV: 73.7 fL — ABNORMAL LOW (ref 80.0–100.0)
MCV: 73.5 fL — ABNORMAL LOW (ref 80.0–100.0)
Monocytes Absolute: 0.9 10*3/uL (ref 0.1–1.0)
Monocytes Absolute: 0.8 10*3/uL (ref 0.1–1.0)
Monocytes Relative: 9 %
Monocytes Relative: 8 %
Neutro Abs: 7.3 10*3/uL (ref 1.7–7.7)
Neutro Abs: 7.2 10*3/uL (ref 1.7–7.7)
Neutrophils Relative %: 68 %
Neutrophils Relative %: 71 %
Platelets: 252 10*3/uL (ref 150–400)
Platelets: 250 10*3/uL (ref 150–400)
RBC: 5.48 MIL/uL — ABNORMAL HIGH (ref 3.87–5.11)
RBC: 5.66 MIL/uL — ABNORMAL HIGH (ref 3.87–5.11)
RDW: 14.6 % (ref 11.5–15.5)
RDW: 14.5 % (ref 11.5–15.5)
WBC: 10.6 10*3/uL — ABNORMAL HIGH (ref 4.0–10.5)
WBC: 10.1 10*3/uL (ref 4.0–10.5)
nRBC: 0 % (ref 0.0–0.2)
nRBC: 0 % (ref 0.0–0.2)

## 2020-06-02 LAB — COMPREHENSIVE METABOLIC PANEL
ALT: 16 U/L (ref 0–44)
AST: 24 U/L (ref 15–41)
Albumin: 4 g/dL (ref 3.5–5.0)
Alkaline Phosphatase: 75 U/L (ref 38–126)
Anion gap: 11 (ref 5–15)
BUN: 7 mg/dL (ref 6–20)
CO2: 26 mmol/L (ref 22–32)
Calcium: 9.3 mg/dL (ref 8.9–10.3)
Chloride: 98 mmol/L (ref 98–111)
Creatinine, Ser: 0.53 mg/dL (ref 0.44–1.00)
GFR calc Af Amer: >60 mL/min (ref >60)
GFR calc non Af Amer: >60 mL/min (ref >60)
Glucose, Bld: 120 mg/dL — ABNORMAL HIGH (ref 70–99)
Potassium: 2.7 mmol/L — CL (ref 3.5–5.1)
Sodium: 135 mmol/L (ref 135–145)
Total Bilirubin: 0.7 mg/dL (ref 0.3–1.2)
Total Protein: 7.5 g/dL (ref 6.5–8.1)

## 2020-06-02 LAB — HEPATIC FUNCTION PANEL
ALT: 18 U/L (ref 0–44)
AST: 24 U/L (ref 15–41)
Albumin: 4 g/dL (ref 3.5–5.0)
Alkaline Phosphatase: 79 U/L (ref 38–126)
Bilirubin, Direct: 0.1 mg/dL (ref 0.0–0.2)
Indirect Bilirubin: 0.8 mg/dL (ref 0.3–0.9)
Total Bilirubin: 0.9 mg/dL (ref 0.3–1.2)
Total Protein: 7.8 g/dL (ref 6.5–8.1)

## 2020-06-02 LAB — MAGNESIUM
Magnesium: 2 mg/dL (ref 1.7–2.4)
Magnesium: 1.9 mg/dL (ref 1.7–2.4)

## 2020-06-02 LAB — HIV ANTIBODY (ROUTINE TESTING W REFLEX): HIV Screen 4th Generation wRfx: NONREACTIVE

## 2020-06-02 MED ORDER — IOHEXOL 300 MG/ML  SOLN
100.0000 mL | Freq: Once | INTRAMUSCULAR | Status: AC | PRN
  Administered 2020-06-02: 100 mL via INTRAVENOUS

## 2020-06-02 MED ORDER — GABAPENTIN 100 MG PO CAPS
100.0000 mg | ORAL_CAPSULE | Freq: Every day | ORAL | Status: DC
  Administered 2020-06-02 – 2020-06-05 (×4): 100 mg via ORAL
  Filled 2020-06-02 (×4): qty 1

## 2020-06-02 MED ORDER — ONDANSETRON HCL 4 MG/2ML IJ SOLN: 4.0000 mg | Freq: Four times a day (QID) | INTRAMUSCULAR | Status: DC | PRN

## 2020-06-02 MED ORDER — POTASSIUM CHLORIDE 2 MEQ/ML IV SOLN: INTRAVENOUS | Status: DC

## 2020-06-02 MED ORDER — SUCRALFATE 1 G PO TABS
1.0000 g | ORAL_TABLET | Freq: Three times a day (TID) | ORAL | Status: DC
  Administered 2020-06-02 – 2020-06-05 (×11): 1 g via ORAL
  Filled 2020-06-02 (×12): qty 1

## 2020-06-02 MED ORDER — POTASSIUM CHLORIDE 2 MEQ/ML IV SOLN
INTRAVENOUS | Status: AC
  Filled 2020-06-02 (×3): qty 1000

## 2020-06-02 MED ORDER — MELATONIN 3 MG PO TABS
6.0000 mg | ORAL_TABLET | Freq: Every evening | ORAL | Status: DC | PRN
  Administered 2020-06-02 – 2020-06-04 (×3): 6 mg via ORAL
  Filled 2020-06-02 (×3): qty 2

## 2020-06-02 MED ORDER — ESCITALOPRAM OXALATE 20 MG PO TABS
20.0000 mg | ORAL_TABLET | Freq: Every day | ORAL | Status: DC
  Administered 2020-06-02 – 2020-06-05 (×4): 20 mg via ORAL
  Filled 2020-06-02 (×4): qty 1

## 2020-06-02 MED ORDER — GABAPENTIN 300 MG PO CAPS
300.0000 mg | ORAL_CAPSULE | Freq: Every day | ORAL | Status: DC
  Administered 2020-06-02 – 2020-06-04 (×3): 300 mg via ORAL
  Filled 2020-06-02 (×3): qty 1

## 2020-06-02 MED ORDER — ACETAMINOPHEN 325 MG PO TABS
650.0000 mg | ORAL_TABLET | Freq: Four times a day (QID) | ORAL | Status: DC | PRN
  Administered 2020-06-04: 650 mg via ORAL
  Filled 2020-06-02: qty 2

## 2020-06-02 MED ORDER — POTASSIUM CHLORIDE 10 MEQ/100ML IV SOLN
10.0000 meq | INTRAVENOUS | Status: AC
  Administered 2020-06-02 (×4): 10 meq via INTRAVENOUS
  Filled 2020-06-02 (×4): qty 100

## 2020-06-02 MED ORDER — ONDANSETRON HCL 4 MG PO TABS: 4.0000 mg | ORAL_TABLET | Freq: Four times a day (QID) | ORAL | Status: DC | PRN

## 2020-06-02 MED ORDER — HYDRALAZINE HCL 20 MG/ML IJ SOLN: 10.0000 mg | INTRAMUSCULAR | Status: DC | PRN

## 2020-06-02 MED ORDER — ACETAMINOPHEN 650 MG RE SUPP: 650.0000 mg | Freq: Four times a day (QID) | RECTAL | Status: DC | PRN

## 2020-06-02 MED ORDER — POTASSIUM CHLORIDE IN NACL 20-0.9 MEQ/L-% IV SOLN
INTRAVENOUS | Status: DC
  Filled 2020-06-02: qty 1000

## 2020-06-02 MED ORDER — GABAPENTIN 100 MG PO CAPS: 100.0000 mg | ORAL_CAPSULE | ORAL | Status: DC

## 2020-06-02 MED ORDER — PANTOPRAZOLE SODIUM 40 MG PO TBEC
40.0000 mg | DELAYED_RELEASE_TABLET | Freq: Every day | ORAL | Status: DC
  Administered 2020-06-02 – 2020-06-05 (×4): 40 mg via ORAL
  Filled 2020-06-02 (×4): qty 1

## 2020-06-02 MED ORDER — METOPROLOL SUCCINATE ER 25 MG PO TB24
12.5000 mg | ORAL_TABLET | Freq: Every day | ORAL | Status: DC
  Administered 2020-06-02 – 2020-06-03 (×2): 25 mg via ORAL
  Administered 2020-06-04 – 2020-06-05 (×2): 12.5 mg via ORAL
  Filled 2020-06-02 (×4): qty 1

## 2020-06-02 MED ORDER — SODIUM CHLORIDE (PF) 0.9 % IJ SOLN
INTRAMUSCULAR | Status: AC
  Filled 2020-06-02: qty 50

## 2020-06-02 MED ORDER — ALPRAZOLAM 0.5 MG PO TABS
0.5000 mg | ORAL_TABLET | Freq: Two times a day (BID) | ORAL | Status: DC | PRN
  Administered 2020-06-02 – 2020-06-03 (×2): 0.5 mg via ORAL
  Filled 2020-06-02 (×2): qty 1

## 2020-06-02 MED ORDER — ONDANSETRON HCL 4 MG/2ML IJ SOLN
4.0000 mg | Freq: Three times a day (TID) | INTRAMUSCULAR | Status: DC | PRN
  Administered 2020-06-02 – 2020-06-04 (×6): 4 mg via INTRAVENOUS
  Filled 2020-06-02 (×6): qty 2

## 2020-06-02 NOTE — Progress Notes (Signed)
PROGRESS NOTE    Mikayla Hart  CHE:[id number] DOB: 1961/09/05 DOA: 06/01/2020 PCP: Patient, No Pcp Per  Brief Narrative:  59 year old white female Cauda equina syndrome L4-L5 broad-based herniation with severe spinal stenosis + central stenosis 33 C4 and left-sided foraminal stenosis C6 and 7 status post laminectomy at [facility] under Dr. 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  Multifocal sigmoid narrowing Gastric emptying study was normal She was evaluated by Dr. Sundra Aland 07/05/2019 for the chronic nausea at the hospital in [place]?  [facility]  Returns to [employer] 06/01/2020?  Altered mental status nausea vomiting and was talking out of her head apparently -UDS was positive for THC CT head was negative for any intracranial abnormality Chest x-ray was negative  Assessment & Plan:   Principal Problem:   Acute encephalopathy Active Problems:   Hypokalemia   Hypomagnesemia   Non-intractable vomiting   Essential hypertension   1. Toxic metabolic encephalopathy a. Currently completely resolved and no deleterious effects or no source therefore no further work-up 2. Subacute nausea vomiting in the setting of prior h/o multiple med intolerances and recreational cannabis use?  Cyclical vomiting syndrome a. CT scan abdomen pelvis is benign without any findings 8/30 b. Has some point tenderness in the epigastrium therefore we will start Carafate and Protonix  c.  advanced her diet and will leave it to her discretion to advance today but will reinforce the need to start eating tomorrow if no overt vomiting today d. Have counseled her  regarding the use of cannabis and the link with hyperemesis syndrome and she tells me that she cannot tolerate most opiates for pain and uses this to help with pain last use was Friday 3. Recent lumbar laminectomy 05/30/2020 a. Pain seems relatively well controlled at this time would continue on current medications Tylenol mainly 4. Severe hypokalemia being replaced both orally and IV a. Continue IV fluid with 40 of K magnesium replaced giving potassium runs in addition and will recheck labs in the morning b. Will recheck magnesium in addition c. Suspect this is all secondary to her nausea and vomiting 5. Anxiety depression a. Continue Lexapro 20 daily, and gabapentin as as needed 6. HTN a. Holding at this time chlorthalidone as this can worsen electrolyte imbalances as well as Cozaar 100 b. Continue metoprolol XL 7. BMI 32 a. Outpatient monitoring  DVT prophylaxis: SCDs Code Status: Presumed full Family Communication:   None present at this time  disposition:   Status is: Observation  The patient will require care spanning > 2 midnights and should be moved to inpatient because: Hemodynamically unstable, IV treatments appropriate due to intensity of illness or inability to take PO and Inpatient level of care appropriate due to severity of illness  Dispo: The patient is from: Home              Anticipated d/c is to: Home              Anticipated d/c date is: 2 days              Patient currently is not medically stable to d/c.       Consultants:   None yet  Procedures: No  Antimicrobials: No   Subjective: Awake coherent pleasant no distress but does seem to be in mild abdominal discomfort Just does not feel well Nurse reports temperature 99.8 She is not confused at all to my exam She relates that she is intolerant to multiple medications for pain and last smoked a joint on last Friday to help with the back pain She has had issues before where she had prolonged episodes  of vomiting and has been work-up by GI in the past-apparently in 2017 she had a negative endoscopy  Objective: Vitals:   06/01/20 2200 06/02/20 0042 06/02/20 0202 06/02/20 0609  BP: (!) 160/88 (!) 154/79 (!) 158/90 (!) 154/90  Pulse: (!) 54 60 (!) 54 62  Resp: (!) 25 (!) 24 20 18   Temp:  98.9 F (37.2 C) 98.6 F (37 C) 99.1 F (37.3 C)  TempSrc:  Oral Oral Oral  SpO2: 100% 98% 100% 100%  Weight:      Height:        Intake/Output Summary (Last 24 hours) at 06/02/2020 06/04/2020 Last data filed at 06/02/2020 0535 Gross per 24 hour  Intake 1515.11 ml  Output --  Net 1515.11 ml   Filed Weights   06/01/20 1709  Weight: 81.6 kg    Examination:  General exam: Awake alert slightly flat affect no distress but does not seem to comfortable Respiratory system: Clear no added sound no rales no rhonchi  Cardiovascular system: S1-S2 no murmur rub or gallop Gastrointestinal system: Slightly tender in epigastrium. Central nervous system: Neurologically intact no focal deficit moving all four limbs equally Extremities: No lower extremity edema ROM intact lower extremities Skin: Soft supple Psychiatry: Euthymic but slightly flat affect  Data Reviewed: I have personally reviewed following labs and imaging studies Potassium 2.7 magnesium 2.0 after replacement BUNs/creatinine 7/0.5 WBC 10.6 up from 8.7 MCV 73 platelet 252  Radiology Studies: DG Chest 2 View  Result Date: 06/01/2020 CLINICAL DATA:  Generalized abdominal pain and vomiting. EXAM: CHEST - 2 VIEW COMPARISON:  May 11, 2019 FINDINGS: There is no evidence of acute infiltrate, pleural effusion or pneumothorax. The heart size and mediastinal contours are within normal limits. The visualized skeletal structures are unremarkable. IMPRESSION: No active cardiopulmonary disease. Electronically Signed   By: May 13, 2019 M.D.   On: 06/01/2020 20:19   CT Head Wo Contrast  Result Date: 06/01/2020 CLINICAL DATA:  Mental status change,  unknown cause Patient reports back surgery 2 days ago. EXAM: CT HEAD WITHOUT CONTRAST TECHNIQUE: Contiguous axial images were obtained from the base of the skull through the vertex without intravenous contrast. COMPARISON:  Head CT 07/06/2016 FINDINGS: Brain: Brain volume is normal for age. No intracranial hemorrhage, mass effect, or midline shift. No hydrocephalus. The basilar cisterns are patent. No evidence of territorial infarct or acute ischemia. No extra-axial or intracranial fluid collection. Vascular: No hyperdense vessel or unexpected calcification. Skull: No fracture or focal lesion. Sinuses/Orbits: Frontal sinuses are hypo pneumatized. No acute findings. Mastoid air cells are clear. Included orbits are unremarkable. Other: None. IMPRESSION: Negative noncontrast head CT. Electronically Signed   By: 09/05/2016 M.D.   On: 06/01/2020 22:41     Scheduled Meds: Continuous Infusions: . lactated ringers with kcl 100 mL/hr at 06/02/20 0922  . potassium chloride 10 mEq (06/02/20 1043)     LOS: 0 days    Time spent: 40  06/04/20, MD [facility] To contact the attending provider between 7A-7P or the covering provider during after hours 7P-7A,  please log into the web site [email or web address] and access using universal  password for that web site. If you do not have the password, please call the hospital operator.  06/02/2020, 7:22 AM

## 2020-06-02 NOTE — Progress Notes (Signed)
Patient's daughter Levin Erp asked if the MD would call her with an update of the patient's condition. Phone: [phone]

## 2020-06-02 NOTE — H&P (Signed)
History and Physical    Mikayla Hart ULA:[id number] DOB: 10-10-1960 DOA: 06/01/2020  PCP: Patient, No Pcp Per  Patient coming from: Home.  Chief Complaint: Nausea vomiting and confusion.  HPI: Mikayla Hart is a 60 y.o. female with history of hypertension who underwent laminectomy on May 30, 2020 following which patient states she had some difficulty sleeping that night.  The following day was fine but on Sunday that is yesterday patient woke up confused and started having nausea vomiting and diarrhea.  Since the symptoms persisted patient was brought to the ER at [facility].  Denies any headache chest pain shortness of breath.  Patient does have some epigastric pain which happens at the time of vomiting.  No blood in the vomitus or diarrhea.  ED Course: In the ER patient was initially confused but after giving fluids became more alert awake urine drug screen is positive for marijuana which patient states she did take 2 days ago.  Patient denies taking any other pain medications.  Labs are significant for potassium of 2.7 magnesium 1.3 LFTs were normal ammonia normal CT head unremarkable.  Covid test was negative.  Patient admitted for further observation.  At the time of my exam patient is alert awake oriented to time place and person.  Has some nausea vomiting.  EKG shows normal sinus rhythm with prolonged QTC.  Review of Systems: As per HPI, rest all negative.   Past Medical History:  Diagnosis Date  . Hypertension     Past Surgical History:  Procedure Laterality Date  . ABDOMINAL HYSTERECTOMY    . BACK SURGERY    . ORTHOPEDIC SURGERY       reports that she has quit smoking. She has never used smokeless tobacco. She reports current alcohol use. She reports that she does not use drugs.  Allergies  Allergen Reactions  . Sulfa Antibiotics   . Tetracyclines & Related     Family History  Family history unknown: Yes    Prior to Admission medications   Medication  Sig Start Date End Date Taking? Authorizing Provider  hydrochlorothiazide (HYDRODIURIL) 25 MG tablet Take 25 mg by mouth daily.    [provider]    Physical Exam: Constitutional: Moderately built and nourished. Vitals:   06/01/20 2045 06/01/20 2100 06/01/20 2200 06/02/20 0042  BP: (!) 166/84 (!) 175/87 (!) 160/88 (!) 154/79  Pulse: (!) 47 (!) 52 (!) 54 60  Resp: (!) 22 (!) 24 (!) 25 (!) 24  Temp:    98.9 F (37.2 C)  TempSrc:    Oral  SpO2: 100% 100% 100% 98%  Weight:      Height:       Eyes: Anicteric no pallor. ENMT: No discharge from the ears eyes nose or mouth. Neck: No mass felt.  No neck rigidity. Respiratory: No rhonchi or crepitations. Cardiovascular: S1-S2 heard. Abdomen: Soft nontender bowel sounds present. Musculoskeletal: No edema. Skin: No rash. Neurologic: Alert awake oriented to time place and person.  Moves all extremities. Psychiatric: Appears normal.  Normal affect.   Labs on Admission: I have personally reviewed following labs and imaging studies  CBC: Recent Labs  Lab 06/01/20 1718  WBC 8.7  HGB 13.8  HCT 43.8  MCV 74.4*  PLT 257   Basic Metabolic Panel: Recent Labs  Lab 06/01/20 1718 06/01/20 2021  NA 137  --   K 2.7*  --   CL 97*  --   CO2 24  --   GLUCOSE 143*  --  BUN 8  --   CREATININE 0.60  --   CALCIUM 10.2  --   MG  --  1.3*   GFR: Estimated Creatinine Clearance: 74.9 mL/min (by C-G formula based on SCr of 0.6 mg/dL). Liver Function Tests: Recent Labs  Lab 06/01/20 1718  AST 29  ALT 18  ALKPHOS 83  BILITOT 0.8  PROT 8.3*  ALBUMIN 4.6   Recent Labs  Lab 06/01/20 1718  LIPASE 21   Recent Labs  Lab 06/01/20 2021  AMMONIA 23   Coagulation Profile: No results for input(s): INR, PROTIME in the last 168 hours. Cardiac Enzymes: No results for input(s): CKTOTAL, CKMB, CKMBINDEX, TROPONINI in the last 168 hours. BNP (last 3 results) No results for input(s): PROBNP in the last 8760 hours. HbA1C: No  results for input(s): HGBA1C in the last 72 hours. CBG: No results for input(s): GLUCAP in the last 168 hours. Lipid Profile: No results for input(s): CHOL, HDL, LDLCALC, TRIG, CHOLHDL, LDLDIRECT in the last 72 hours. Thyroid Function Tests: No results for input(s): TSH, T4TOTAL, FREET4, T3FREE, THYROIDAB in the last 72 hours. Anemia Panel: No results for input(s): VITAMINB12, FOLATE, FERRITIN, TIBC, IRON, RETICCTPCT in the last 72 hours. Urine analysis:    Component Value Date/Time   COLORURINE YELLOW 06/01/2020 2218   APPEARANCEUR CLEAR 06/01/2020 2218   LABSPEC 1.025 06/01/2020 2218   PHURINE 7.5 06/01/2020 2218   GLUCOSEU NEGATIVE 06/01/2020 2218   HGBUR MODERATE (A) 06/01/2020 2218   BILIRUBINUR NEGATIVE 06/01/2020 2218   KETONESUR 40 (A) 06/01/2020 2218   PROTEINUR 100 (A) 06/01/2020 2218   NITRITE NEGATIVE 06/01/2020 2218   LEUKOCYTESUR NEGATIVE 06/01/2020 2218   Sepsis Labs: @LABRCNTIP (procalcitonin:4,lacticidven:4) ) Recent Results (from the past 240 hour(s))  SARS Coronavirus 2 by RT PCR (hospital order, performed in [facility] hospital lab) Nasopharyngeal Nasopharyngeal Swab     Status: None   Collection Time: 06/01/20  8:16 PM   Specimen: Nasopharyngeal Swab  Result Value Ref Range Status   SARS Coronavirus 2 NEGATIVE NEGATIVE Final    Comment: (NOTE) SARS-CoV-2 target nucleic acids are NOT DETECTED.  The SARS-CoV-2 RNA is generally detectable in upper and lower respiratory specimens during the acute phase of infection. The lowest concentration of SARS-CoV-2 viral copies this assay can detect is 250 copies / mL. A negative result does not preclude SARS-CoV-2 infection and should not be used as the sole basis for treatment or other patient management decisions.  A negative result may occur with improper specimen collection / handling, submission of specimen other than nasopharyngeal swab, presence of viral mutation(s) within the areas targeted by this assay,  and inadequate number of viral copies (<250 copies / mL). A negative result must be combined with clinical observations, patient history, and epidemiological information.  Fact Sheet for Patients:   06/03/20  Fact Sheet for Healthcare Providers: BoilerBrush.com.cy  This test is not yet approved or  cleared by the https://pope.com/ FDA and has been authorized for detection and/or diagnosis of SARS-CoV-2 by FDA under an Emergency Use Authorization (EUA).  This EUA will remain in effect (meaning this test can be used) for the duration of the COVID-19 declaration under Section 564(b)(1) of the Act, 21 U.S.C. section 360bbb-3(b)(1), unless the authorization is terminated or revoked sooner.  Performed at [facility], [address] [place], [place]      Radiological Exams on Admission: DG Chest 2 View  Result Date: 06/01/2020 CLINICAL DATA:  Generalized abdominal pain and vomiting.  EXAM: CHEST - 2 VIEW COMPARISON:  May 11, 2019 FINDINGS: There is no evidence of acute infiltrate, pleural effusion or pneumothorax. The heart size and mediastinal contours are within normal limits. The visualized skeletal structures are unremarkable. IMPRESSION: No active cardiopulmonary disease. Electronically Signed   By: Aram Candela M.D.   On: 06/01/2020 20:19   CT Head Wo Contrast  Result Date: 06/01/2020 CLINICAL DATA:  Mental status change, unknown cause Patient reports back surgery 2 days ago. EXAM: CT HEAD WITHOUT CONTRAST TECHNIQUE: Contiguous axial images were obtained from the base of the skull through the vertex without intravenous contrast. COMPARISON:  Head CT 07/06/2016 FINDINGS: Brain: Brain volume is normal for age. No intracranial hemorrhage, mass effect, or midline shift. No hydrocephalus. The basilar cisterns are patent. No evidence of territorial infarct or acute ischemia. No extra-axial or intracranial fluid  collection. Vascular: No hyperdense vessel or unexpected calcification. Skull: No fracture or focal lesion. Sinuses/Orbits: Frontal sinuses are hypo pneumatized. No acute findings. Mastoid air cells are clear. Included orbits are unremarkable. Other: None. IMPRESSION: Negative noncontrast head CT. Electronically Signed   By: Narda Rutherford M.D.   On: 06/01/2020 22:41    EKG: Independently reviewed.  Normal sinus rhythm prolonged QTC of 585 ms.  Assessment/Plan Principal Problem:   Acute encephalopathy Active Problems:   Hypokalemia   Hypomagnesemia   Non-intractable vomiting   Essential hypertension    1. Acute encephalopathy cause not clear could be from medications essentially resolved at this time. 2. Intractable nausea vomiting and diarrhea again cause not clear.  Since patient still having vomiting will get a CT scan.  Continue with hydration closely monitor.  Since patient has prolonged QTC if patient has vomiting will need to give Ativan. 3. Hypertension we will keep patient on as needed IV hydralazine until patient vomiting improves. 4. Recent laminectomy no acute issues at this time. 5. Severe hypokalemia and hypomagnesemia likely from vomiting and diarrhea replace recheck. 6. Prolonged QTC likely from electrolyte imbalance avoid QT prolonging medication. 7. Marijuana abuse could be causing intractable vomiting.  Advised about quitting.   DVT prophylaxis: Lovenox. Code Status: Full code. Family Communication: Discussed with patient. Disposition Plan: Home. Consults called: None. Admission status: Observation.   Eduard Clos MD [facility] Pager [phone].  If 7PM-7AM, please contact night-coverage [email or web address] Password [email or web address]  06/02/2020, 1:49 AM

## 2020-06-02 NOTE — Progress Notes (Signed)
Patient received direct admit from [facility] ED  via EMS. On-call MD notified.   06/02/20 0202  Vitals  Temp 98.6 F (37 C)  Temp Source Oral  BP (!) 158/90  MAP (mmHg) 112  BP Location Right Arm  BP Method Automatic  Patient Position (if appropriate) Lying  Pulse Rate (!) 54  Pulse Rate Source Monitor  Resp 20  MEWS COLOR  MEWS Score Color Green  Oxygen Therapy  SpO2 100 %  O2 Device Room Air   JulianneRN 06/02/20 0205

## 2020-06-02 NOTE — Progress Notes (Signed)
  CRITICAL VALUE ALERT  Critical Value:  K 2.7  Date & Time Notied:  06/03/20 0705  Provider Notified:  MESSAGE SENT  Orders Received/Actions taken: AWAITING

## 2020-06-02 NOTE — Progress Notes (Signed)
CORRECTION FOR MY LAST NOTE:  TO CORRECT THE DATE: 06/02/20  Jackquline Berlin

## 2020-06-02 NOTE — ED Notes (Signed)
[facility] notified (Mikayla Hart) - patient ready for transport 

## 2020-06-02 NOTE — Progress Notes (Signed)
Patient vomited x1.  Patient states "it just comes out of nowhere".  PRN zofran given.  BP also elevated at 168/82, MD made aware.

## 2020-06-03 MED ORDER — LOSARTAN POTASSIUM 50 MG PO TABS
100.0000 mg | ORAL_TABLET | Freq: Every day | ORAL | Status: DC
  Administered 2020-06-03 – 2020-06-04 (×2): 100 mg via ORAL
  Filled 2020-06-03 (×2): qty 2

## 2020-06-03 MED ORDER — POTASSIUM CHLORIDE 2 MEQ/ML IV SOLN
INTRAVENOUS | Status: DC
  Filled 2020-06-03: qty 1000

## 2020-06-03 MED ORDER — POTASSIUM CHLORIDE CRYS ER 20 MEQ PO TBCR
40.0000 meq | EXTENDED_RELEASE_TABLET | Freq: Every day | ORAL | Status: DC
  Administered 2020-06-03: 40 meq via ORAL
  Filled 2020-06-03: qty 2

## 2020-06-03 MED ORDER — HYOSCYAMINE SULFATE 0.125 MG SL SUBL
0.2500 mg | SUBLINGUAL_TABLET | Freq: Once | SUBLINGUAL | Status: AC
  Administered 2020-06-03: 0.25 mg via SUBLINGUAL
  Filled 2020-06-03: qty 2

## 2020-06-03 MED ORDER — FAMOTIDINE 20 MG PO TABS
20.0000 mg | ORAL_TABLET | Freq: Every day | ORAL | Status: DC
  Administered 2020-06-03 – 2020-06-05 (×3): 20 mg via ORAL
  Filled 2020-06-03 (×3): qty 1

## 2020-06-03 NOTE — Progress Notes (Signed)
PROGRESS NOTE    Mikayla Hart  DEY:[id number] DOB: [birth year] DOA: 06/01/2020 PCP: Patient, No Pcp Per  Brief Narrative:  59 year old white female Cauda equina syndrome L4-L5 broad-based herniation with severe spinal stenosis + central stenosis 33 C4 and left-sided foraminal stenosis C6 and 7 status post laminectomy at [facility] under Dr. 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  Multifocal sigmoid narrowing Gastric emptying study was normal She was evaluated by Dr. Sundra Aland 07/05/2019 for the chronic nausea at the hospital in [place]?  [facility]  Returns to [facility] 06/01/2020?  Altered mental status nausea vomiting and was talking out of her head apparently -UDS was positive for THC CT head was negative for any intracranial abnormality Chest x-ray was negative  Assessment & Plan:   Principal Problem:   Acute encephalopathy Active Problems:   Hypokalemia   Hypomagnesemia   Non-intractable vomiting   Essential hypertension   Acute metabolic encephalopathy   1. Toxic metabolic encephalopathy a. Currently completely resolved and no deleterious effects or no source therefore no further work-up 2. Subacute nausea vomiting in the setting of prior h/o multiple med intolerances and recreational cannabis use?  Cyclical vomiting syndrome a. CT scan abdomen pelvis is benign without any findings 8/30 b. Has some point tenderness in the epigastrium therefore we will start Carafate and Protonix --- mildly improved but not resolved therefore adding Levsin and Pepcid in addition c.  advanced diet as tolerated d. Continue lower rate of IV fluid  as below with potassium e. Have counseled her regarding the use of cannabis and the link with hyperemesis syndrome and she tells me that she cannot tolerate most opiates for pain and uses this to help with pain last use was Friday 3. Recent lumbar laminectomy 05/30/2020 a. Pain seems relatively well controlled at this time would continue on current medications Tylenol mainly 4. Severe hypokalemia being replaced both orally and IV a. Continue IV fluid at 50 cc an hour with 40 of K magnesium replaced giving potassium runs in addition and will recheck labs in the morning b. Slowly resolving-continue replacement with oral and IV 5. Anxiety depression a. Continue Lexapro 20 daily, and gabapentin as as needed 6. HTN a. Holding at this time chlorthalidone secondary to low potassium b. Resumed Cozaar 100 c. Continue metoprolol XL 7. BMI 32 a. Outpatient monitoring  DVT prophylaxis: SCDs Code Status: Presumed full Family Communication:   Discussed with grandson who was present at the bedside  disposition:   Status is: Observation  The patient will require care spanning > 2 midnights and should be moved to inpatient because: Hemodynamically unstable, IV treatments appropriate due to intensity of illness or inability to take PO and Inpatient level of care appropriate due to severity of illness  Dispo: The patient is from: Home              Anticipated d/c is to: Home              Anticipated d/c date is: 2 days              Patient currently is not medically stable to d/c.       Consultants:  None yet  Procedures: No  Antimicrobials: No   Subjective: Has had an extensive work-up in the past including a HIDA scan for abdominal pain She states that she is eating a little better tolerating applesauce but still some epigastric pain discomfort She felt some palpitations last night I wonder if she has functional abdominal issues?  She may be well served on Prozac for the same Tells me  that was eating regular diet until Saturday  Objective: Vitals:   06/02/20 1448 06/02/20 2033 06/03/20 0555 06/03/20 0943  BP: (!) 168/82 (!) 155/107 (!) 169/86 (!) 173/100  Pulse:  (!) 54 73 (!) 58  Resp:  16 16 18   Temp:  98.7 F (37.1 C) 98.6 F (37 C) 98.9 F (37.2 C)  TempSrc:  Oral Oral Oral  SpO2:  100% 100% 100%  Weight:      Height:        Intake/Output Summary (Last 24 hours) at 06/03/2020 1123 Last data filed at 06/03/2020 1028 Gross per 24 hour  Intake 1838.84 ml  Output 1150 ml  Net 688.84 ml   Filed Weights   06/01/20 1709  Weight: 81.6 kg    Examination:  General exam: Awake alert slightly anxious Respiratory system: Clear no added sound no rales no rhonchi  Cardiovascular system: S1-S2 no murmur rub or gallop Gastrointestinal system: Tender in epigastrium, no right upper quadrant tenderness Central nervous system: Neurologically intact no focal deficit moving all four limbs equally Extremities: No lower extremity edema ROM intact lower extremities Skin: Soft supple Psychiatry: Euthymic but slightly flat affect  Data Reviewed: I have personally reviewed following labs and imaging studies Potassium up to 3.1 BUNs/creatinine 8/0.6 Magnesium 1.9 WBC 10.1 Hemoglobin 13.6  Radiology Studies: DG Chest 2 View  Result Date: 06/01/2020 CLINICAL DATA:  Generalized abdominal pain and vomiting. EXAM: CHEST - 2 VIEW COMPARISON:  May 11, 2019 FINDINGS: There is no evidence of acute infiltrate, pleural effusion or pneumothorax. The heart size and mediastinal contours are within normal limits. The visualized skeletal structures are unremarkable. IMPRESSION: No active cardiopulmonary disease. Electronically Signed   By: May 13, 2019 M.D.   On: 06/01/2020 20:19   CT Head Wo Contrast  Result Date: 06/01/2020 CLINICAL DATA:  Mental status change, unknown cause Patient reports back surgery 2 days ago. EXAM: CT HEAD WITHOUT CONTRAST TECHNIQUE: Contiguous axial  images were obtained from the base of the skull through the vertex without intravenous contrast. COMPARISON:  Head CT 07/06/2016 FINDINGS: Brain: Brain volume is normal for age. No intracranial hemorrhage, mass effect, or midline shift. No hydrocephalus. The basilar cisterns are patent. No evidence of territorial infarct or acute ischemia. No extra-axial or intracranial fluid collection. Vascular: No hyperdense vessel or unexpected calcification. Skull: No fracture or focal lesion. Sinuses/Orbits: Frontal sinuses are hypo pneumatized. No acute findings. Mastoid air cells are clear. Included orbits are unremarkable. Other: None. IMPRESSION: Negative noncontrast head CT. Electronically Signed   By: 09/05/2016 M.D.   On: 06/01/2020 22:41   CT ABDOMEN PELVIS W CONTRAST  Result Date: 06/02/2020 CLINICAL DATA:  Epigastric pain. Diarrhea, nausea, vomiting for 2 days. EXAM: CT ABDOMEN AND PELVIS WITH CONTRAST TECHNIQUE: Multidetector CT imaging of the abdomen and pelvis was performed using the standard protocol following bolus administration of intravenous contrast. CONTRAST:  06/04/2020 OMNIPAQUE IOHEXOL 300 MG/ML  SOLN COMPARISON:  04/22/2017 FINDINGS: Lower chest: No acute abnormality. Hepatobiliary: No focal liver abnormality is seen. No gallstones, gallbladder wall thickening, or biliary dilatation. Pancreas: Unremarkable. No pancreatic  ductal dilatation or surrounding inflammatory changes. Spleen: Normal in size without focal abnormality. Adrenals/Urinary Tract: Adrenal glands are unremarkable. Kidneys are normal, without renal calculi, focal lesion, or hydronephrosis. Bladder is unremarkable. Stomach/Bowel: The stomach appears nondistended. No small bowel wall thickening, inflammation or distension. The appendix is visualized and appears normal. Scattered colonic diverticula identified. No acute inflammation. Vascular/Lymphatic: No significant vascular findings are present. No enlarged abdominal or pelvic lymph  nodes. Reproductive: Status post hysterectomy. No adnexal masses. Other: There is a small volume of fluid identified within the posterior pelvis. Musculoskeletal: No acute or significant osseous findings. Mild degenerative changes noted throughout the lumbar spine. IMPRESSION: 1. No acute findings identified within the abdomen or pelvis. 2. Small volume of fluid identified within the posterior pelvis. Electronically Signed   By: Signa Kell M.D.   On: 06/02/2020 09:32     Scheduled Meds: . escitalopram  20 mg Oral Daily  . gabapentin  100 mg Oral Daily   And  . gabapentin  300 mg Oral QHS  . metoprolol succinate  12.5-25 mg Oral Daily  . pantoprazole  40 mg Oral Daily  . potassium chloride  40 mEq Oral Daily  . sucralfate  1 g Oral TID WC & HS   Continuous Infusions: . lactated ringers with kcl       LOS: 1 day    Time spent: 20  Rhetta Mura, MD [facility] To contact the attending provider between 7A-7P or the covering provider during after hours 7P-7A, please log into the web site [email or web address] and access using universal [place] password for that web site. If you do not have the password, please call the hospital operator.  06/03/2020, 11:23 AM

## 2020-06-03 NOTE — Progress Notes (Signed)
PO intake remains poor. Patient complains of continued nausea. Antiemetic administered. Will continue to monitor.

## 2020-06-04 DIAGNOSIS — E871 Hypo-osmolality and hyponatremia: Secondary | ICD-10-CM

## 2020-06-04 DIAGNOSIS — F329 Major depressive disorder, single episode, unspecified: Secondary | ICD-10-CM

## 2020-06-04 DIAGNOSIS — M4800 Spinal stenosis, site unspecified: Secondary | ICD-10-CM

## 2020-06-04 DIAGNOSIS — F1290 Cannabis use, unspecified, uncomplicated: Secondary | ICD-10-CM

## 2020-06-04 DIAGNOSIS — R112 Nausea with vomiting, unspecified: Secondary | ICD-10-CM

## 2020-06-04 DIAGNOSIS — F419 Anxiety disorder, unspecified: Secondary | ICD-10-CM

## 2020-06-04 LAB — COMPREHENSIVE METABOLIC PANEL
ALT: 16 U/L (ref 0–44)
AST: 18 U/L (ref 15–41)
Albumin: 3.8 g/dL (ref 3.5–5.0)
Alkaline Phosphatase: 71 U/L (ref 38–126)
Anion gap: 11 (ref 5–15)
BUN: 14 mg/dL (ref 6–20)
CO2: 26 mmol/L (ref 22–32)
Calcium: 9.3 mg/dL (ref 8.9–10.3)
Chloride: 96 mmol/L — ABNORMAL LOW (ref 98–111)
Creatinine, Ser: 0.69 mg/dL (ref 0.44–1.00)
GFR calc Af Amer: >60 mL/min (ref >60)
GFR calc non Af Amer: >60 mL/min (ref >60)
Glucose, Bld: 97 mg/dL (ref 70–99)
Potassium: 3.2 mmol/L — ABNORMAL LOW (ref 3.5–5.1)
Sodium: 133 mmol/L — ABNORMAL LOW (ref 135–145)
Total Bilirubin: 1.2 mg/dL (ref 0.3–1.2)
Total Protein: 7.2 g/dL (ref 6.5–8.1)

## 2020-06-04 LAB — CBC WITH DIFFERENTIAL/PLATELET
Abs Immature Granulocytes: 0.05 10*3/uL (ref 0.00–0.07)
Basophils Absolute: 0 10*3/uL (ref 0.0–0.1)
Basophils Relative: 0 %
Eosinophils Absolute: 0 10*3/uL (ref 0.0–0.5)
Eosinophils Relative: 0 %
HCT: 43.9 % (ref 36.0–46.0)
Hemoglobin: 14 g/dL (ref 12.0–15.0)
Immature Granulocytes: 1 %
Lymphocytes Relative: 27 %
Lymphs Abs: 2.9 10*3/uL (ref 0.7–4.0)
MCH: 23.6 pg — ABNORMAL LOW (ref 26.0–34.0)
MCHC: 31.9 g/dL (ref 30.0–36.0)
MCV: 74.2 fL — ABNORMAL LOW (ref 80.0–100.0)
Monocytes Absolute: 1.1 10*3/uL — ABNORMAL HIGH (ref 0.1–1.0)
Monocytes Relative: 11 %
Neutro Abs: 6.6 10*3/uL (ref 1.7–7.7)
Neutrophils Relative %: 61 %
Platelets: 274 10*3/uL (ref 150–400)
RBC: 5.92 MIL/uL — ABNORMAL HIGH (ref 3.87–5.11)
RDW: 14.8 % (ref 11.5–15.5)
WBC: 10.7 10*3/uL — ABNORMAL HIGH (ref 4.0–10.5)
nRBC: 0 % (ref 0.0–0.2)

## 2020-06-04 LAB — MAGNESIUM: Magnesium: 2 mg/dL (ref 1.7–2.4)

## 2020-06-04 MED ORDER — SUCRALFATE 1 G PO TABS: 1.0000 g | ORAL_TABLET | Freq: Three times a day (TID) | ORAL | 0 refills | Status: DC

## 2020-06-04 MED ORDER — ONDANSETRON HCL 4 MG PO TABS: 4.0000 mg | ORAL_TABLET | Freq: Three times a day (TID) | ORAL | 0 refills | Status: AC | PRN

## 2020-06-04 MED ORDER — AMLODIPINE BESYLATE 5 MG PO TABS: 5.0000 mg | ORAL_TABLET | Freq: Every day | ORAL | 1 refills | Status: DC

## 2020-06-04 MED ORDER — METOCLOPRAMIDE HCL 5 MG/ML IJ SOLN
5.0000 mg | Freq: Three times a day (TID) | INTRAMUSCULAR | Status: DC | PRN
  Administered 2020-06-04 – 2020-06-05 (×2): 5 mg via INTRAVENOUS
  Filled 2020-06-04 (×2): qty 2

## 2020-06-04 MED ORDER — POTASSIUM CHLORIDE CRYS ER 20 MEQ PO TBCR
40.0000 meq | EXTENDED_RELEASE_TABLET | ORAL | Status: AC
  Administered 2020-06-04 (×2): 40 meq via ORAL
  Filled 2020-06-04 (×2): qty 2

## 2020-06-04 MED ORDER — POTASSIUM CHLORIDE IN NACL 20-0.9 MEQ/L-% IV SOLN
INTRAVENOUS | Status: DC
  Filled 2020-06-04 (×2): qty 1000

## 2020-06-04 MED ORDER — AMLODIPINE BESYLATE 10 MG PO TABS: 10.0000 mg | ORAL_TABLET | Freq: Every day | ORAL | 1 refills | Status: DC

## 2020-06-04 MED ORDER — AMLODIPINE BESYLATE 5 MG PO TABS: 5.0000 mg | ORAL_TABLET | Freq: Every day | ORAL | Status: DC

## 2020-06-04 MED ORDER — CHLORTHALIDONE 25 MG PO TABS: 25.0000 mg | ORAL_TABLET | Freq: Every day | ORAL | Status: DC

## 2020-06-04 NOTE — Progress Notes (Signed)
PROGRESS NOTE  Mikayla Hart HQI:[id number] DOB: September 08, 1961   PCP: Redmond School, NP  Patient is from: Home.  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 Tristan Schroeder branch on 05/30/2020, chronic hypokalemia, elevated liver enzymes, chronic nausea and vomiting, anxiety and depression presenting with altered mental status, nausea and emesis.  UDS was positive for marijuana.  CT head and CXR without acute finding.  Of note, patient had extensive work-up for elevated LFT and chronic nausea and vomiting including MRI, HIDA scan, CT scan and gastric emptying study without significant finding.  Subjective: Seen and examined earlier this morning.  She reports significant improvement but is still anxious about eating.  Still have some nausea but no emesis.  She was able to get up and go to the bathroom without symptoms.  Denies chest pain, shortness of breath, lightheadedness, abdominal pain and focal neuro symptoms in her legs or arms.  Objective: Vitals:   06/03/20 2056 06/04/20 0510 06/04/20 1002 06/04/20 1350  BP: (!) 173/101 (!) 154/106 (!) 127/100 (!) 168/101  Pulse: (!) 59  (!) 48 (!) 58  Resp:  18 16 14   Temp: 98.4 F (36.9 C) 99.1 F (37.3 C)  99.6 F (37.6 C)  TempSrc: Oral Oral  Oral  SpO2: 100% 100% 100% 100%  Weight:      Height:        Intake/Output Summary (Last 24 hours) at 06/04/2020 1719 Last data filed at 06/04/2020 1400 Gross per 24 hour  Intake 1630.02 ml  Output 1100 ml  Net 530.02 ml   Filed Weights   06/01/20 1709  Weight: 81.6 kg    Examination:  GENERAL: No apparent distress.  Nontoxic. HEENT: MMM.  Vision and hearing grossly intact.  NECK: Supple.  No apparent JVD.  RESP:  No IWOB.  Fair aeration bilaterally. CVS:  RRR.  Heart sounds normal.  ABD/GI/GU: BS+. Abd soft, NTND.  MSK/EXT:  Moves extremities. No apparent deformity. No edema.  SKIN: no apparent skin lesion or wound NEURO: Awake, alert and oriented appropriately.  No apparent focal neuro deficit. PSYCH: Calm. Normal affect.  Procedures:  None  Microbiology summarized: COVID-19 PCR negative.  Assessment & Plan: Acute toxic metabolic encephalopathy: unclear etiology but resolved.  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  Extensive work-up in the past without explanation.  Nausea and vomiting seems to have improved but anxious about trying diet.  Psychogenic?  Marijuana could play a role.  Abdominal exam is benign. -Continue Zofran, Protonix, and Carafate. -Add low-dose Reglan -Advance diet as tolerated -Continue gentle IV fluid -Counseled to stop marijuana.  Lumbar spinal canal stenosis/cauda equina status post recent lumbar laminectomy on 05/30/1930 at [facility]-no focal neuro symptoms or bowel or bladder issue -Continue as needed pain medications  Acute on chronic hypokalemia: Likely due to thiazide diuretics.  She is on chlorthalidone 25 mg twice daily at home. -Replenish and recheck -Check magnesium  Mild hyponatremia: Na 133. -Change LR to NS  Essential hypertension: BP slightly elevated. -Continue home losartan. -Continue holding home chlorthalidone in the setting of hyponatremia and hypokalemia -Add low-dose amlodipine -Continue low-dose metoprolol-she takes this for palpitation. -As needed hydralazine  Anxiety and depression: Stable -Continue home Lexapro and gabapentin  Class I obesity Body mass index is 32.92 kg/m.  DVT prophylaxis:  SCDs Start: 06/02/20 0148  Code Status: Full code Family Communication: Patient and/or RN. Available if any question.  Status is: Inpatient  Remains inpatient appropriate because:Persistent severe electrolyte disturbances, IV treatments  appropriate due to intensity of illness or inability to take PO and Inpatient level of care appropriate due to severity of illness   Dispo: The patient is from: Home              Anticipated d/c is to: Home              Anticipated d/c date is: 1 day              Patient currently is not medically stable to d/c.       Consultants:  None   Sch Meds:  Scheduled Meds: . escitalopram  20 mg Oral Daily  . famotidine  20 mg Oral Daily  . gabapentin  100 mg Oral Daily   And  . gabapentin  300 mg Oral QHS  . losartan  100 mg Oral Daily  . metoprolol succinate  12.5-25 mg Oral Daily  . pantoprazole  40 mg Oral Daily  . sucralfate  1 g Oral TID WC & HS   Continuous Infusions: . 0.9 % NaCl with KCl 20 mEq / L 50 mL/hr at 06/04/20 1001   PRN Meds:.acetaminophen **OR** acetaminophen, ALPRAZolam, hydrALAZINE, melatonin, ondansetron (ZOFRAN) IV  Antimicrobials: Anti-infectives (From admission, onward)   None       I have personally reviewed the following labs and images: CBC: Recent Labs  Lab 06/01/20 1718 06/02/20 0531 06/02/20 1043 06/04/20 0423  WBC 8.7 10.6* 10.1 10.7*  NEUTROABS  --  7.3 7.2 6.6  HGB 13.8 13.0 13.6 14.0  HCT 43.8 40.4 41.6 43.9  MCV 74.4* 73.7* 73.5* 74.2*  PLT 257 252 250 274   BMP &GFR Recent Labs  Lab 06/01/20 1718 06/01/20 2021 06/02/20 0531 06/02/20 1043 06/04/20 0423  NA 137  --  135 135 133*  K 2.7*  --  2.7* 3.1* 3.2*  CL 97*  --  98 99 96*  CO2 24  --  26 24 26   GLUCOSE 143*  --  120* 104* 97  BUN 8  --  7 8 14   CREATININE 0.60  --  0.53 0.61 0.69  CALCIUM 10.2  --  9.3 9.3 9.3  MG  --  1.3* 2.0 1.9 2.0   Estimated Creatinine Clearance: 74.9 mL/min (by C-G formula based on SCr of 0.69 mg/dL). Liver & Pancreas: Recent Labs  Lab 06/01/20 1718 06/02/20 0531 06/02/20 1043 06/04/20 0423  AST 29 24 24 18   ALT 18 16 18 16   ALKPHOS 83 75 79 71  BILITOT 0.8 0.7 0.9 1.2  PROT 8.3* 7.5 7.8 7.2  ALBUMIN 4.6 4.0 4.0 3.8    Recent Labs  Lab 06/01/20 1718  LIPASE 21   Recent Labs  Lab 06/01/20 2021  AMMONIA 23   Diabetic: No results for input(s): HGBA1C in the last 72 hours. No results for input(s): GLUCAP in the last 168 hours. Cardiac Enzymes: No results for input(s): CKTOTAL, CKMB, CKMBINDEX, TROPONINI in the last 168 hours. No results for input(s): PROBNP in the last 8760 hours. Coagulation Profile: No results for input(s): INR, PROTIME in the last 168 hours. Thyroid Function Tests: No results for input(s): TSH, T4TOTAL, FREET4, T3FREE, THYROIDAB in the last 72 hours. Lipid Profile: No results for input(s): CHOL, HDL, LDLCALC, TRIG, CHOLHDL, LDLDIRECT in the last 72 hours.  Anemia Panel: No results for input(s): VITAMINB12, FOLATE, FERRITIN, TIBC, IRON, RETICCTPCT in the last 72 hours. Urine analysis:    Component Value Date/Time   COLORURINE YELLOW 06/01/2020 2218   APPEARANCEUR CLEAR 06/01/2020 2218   LABSPEC 1.025 06/01/2020 2218   PHURINE 7.5 06/01/2020 2218   GLUCOSEU NEGATIVE 06/01/2020 2218   HGBUR MODERATE (A) 06/01/2020 2218   BILIRUBINUR NEGATIVE 06/01/2020 2218   KETONESUR 40 (A) 06/01/2020 2218   PROTEINUR 100 (A) 06/01/2020 2218   NITRITE NEGATIVE 06/01/2020 2218   LEUKOCYTESUR NEGATIVE 06/01/2020 2218   Sepsis Labs: Invalid input(s): PROCALCITONIN, LACTICIDVEN  Microbiology: Recent Results (from the past 240 hour(s))  SARS Coronavirus 2 by RT PCR (hospital order, performed in [facility] hospital lab) Nasopharyngeal Nasopharyngeal Swab     Status: None   Collection Time: 06/01/20  8:16 PM   Specimen: Nasopharyngeal Swab  Result Value Ref Range Status   SARS Coronavirus 2 NEGATIVE NEGATIVE Final    Comment: (NOTE) SARS-CoV-2 target nucleic acids are NOT DETECTED.  The SARS-CoV-2 RNA is generally detectable in upper and lower respiratory specimens during the acute phase of infection. The lowest concentration of SARS-CoV-2 viral copies this assay can detect is  250 copies / mL. A negative result does not preclude SARS-CoV-2 infection and should not be used as the sole basis for treatment or other patient management decisions.  A negative result may occur with improper specimen collection / handling, submission of specimen other than nasopharyngeal swab, presence of viral mutation(s) within the areas targeted by this assay, and inadequate number of viral copies (<250 copies / mL). A negative result must be combined with clinical observations, patient history, and epidemiological information.  Fact Sheet for Patients:   BoilerBrush.com.cy  Fact Sheet for Healthcare Providers: https://pope.com/  This test is not yet approved or  cleared by the [place] FDA and has been authorized for detection and/or diagnosis of SARS-CoV-2 by FDA under an Emergency Use Authorization (EUA).  This EUA will remain in effect (meaning this test can be used) for the duration of the COVID-19 declaration under Section 564(b)(1) of the Act, 21 U.S.C. section 360bbb-3(b)(1), unless the authorization is terminated or revoked sooner.  Performed at [facility], [address], [place], [place] [address]     Radiology Studies: No results found.    Charman Blasco T. Ishi Danser [facility]  If 7PM-7AM, please contact night-coverage [email or web address] 06/04/2020, 5:19 PM

## 2020-06-04 NOTE — Progress Notes (Signed)
Patient refused Amlodipine. Patient reports taking medication in the past and had to discontinue due to cough.  Provider updated.

## 2020-06-04 NOTE — Progress Notes (Signed)
Patient is unable to tolerate diet at this time. Patient complaining of nausea. Will update provider.

## 2020-06-04 NOTE — Plan of Care (Signed)
  Problem: Health Behavior/Discharge Planning: Goal: Ability to manage health-related needs will improve Outcome: Progressing   Problem: Clinical Measurements: Goal: Ability to maintain clinical measurements within normal limits will improve Outcome: Progressing Goal: Will remain free from infection Outcome: Progressing Goal: Diagnostic test results will improve Outcome: Progressing Goal: Respiratory complications will improve Outcome: Progressing   Problem: Activity: Goal: Risk for activity intolerance will decrease Outcome: Progressing   Problem: Nutrition: Goal: Adequate nutrition will be maintained Outcome: Progressing   Problem: Coping: Goal: Level of anxiety will decrease Outcome: Progressing   Problem: Elimination: Goal: Will not experience complications related to bowel motility Outcome: Progressing   Problem: Pain Managment: Goal: General experience of comfort will improve Outcome: Progressing   Problem: Safety: Goal: Ability to remain free from injury will improve Outcome: Progressing   

## 2020-06-04 NOTE — TOC Progression Note (Signed)
Transition of Care [facility]) - Progression Note    Patient Details  Name: Mikayla Hart MRN: [id number] Date of Birth: October 21, 1960  [facility] CM/SW Contact  Geni Bers, RN Phone Number: 06/04/2020, 11:06 AM  Clinical Narrative:    Pt will discharge home with no needs.    Expected Discharge Plan: Home/Self Care Barriers to Discharge: No Barriers Identified  Expected Discharge Plan and Services Expected Discharge Plan: Home/Self Care       Living arrangements for the past 2 months: Single Family Home                                       Social Determinants of Health (SDOH) Interventions    Readmission Risk Interventions No flowsheet data found.

## 2020-06-05 DIAGNOSIS — G9341 Metabolic encephalopathy: Secondary | ICD-10-CM

## 2020-06-05 DIAGNOSIS — R1110 Vomiting, unspecified: Secondary | ICD-10-CM

## 2020-06-05 LAB — COMPREHENSIVE METABOLIC PANEL
ALT: 15 U/L (ref 0–44)
AST: 17 U/L (ref 15–41)
Albumin: 3.5 g/dL (ref 3.5–5.0)
Alkaline Phosphatase: 65 U/L (ref 38–126)
Anion gap: 7 (ref 5–15)
BUN: 18 mg/dL (ref 6–20)
CO2: 27 mmol/L (ref 22–32)
Calcium: 9 mg/dL (ref 8.9–10.3)
Chloride: 103 mmol/L (ref 98–111)
Creatinine, Ser: 0.93 mg/dL (ref 0.44–1.00)
GFR calc Af Amer: >60 mL/min (ref >60)
GFR calc non Af Amer: >60 mL/min (ref >60)
Glucose, Bld: 94 mg/dL (ref 70–99)
Potassium: 4 mmol/L (ref 3.5–5.1)
Sodium: 137 mmol/L (ref 135–145)
Total Bilirubin: 1 mg/dL (ref 0.3–1.2)
Total Protein: 6.3 g/dL — ABNORMAL LOW (ref 6.5–8.1)

## 2020-06-05 LAB — CBC WITH DIFFERENTIAL/PLATELET
Abs Immature Granulocytes: 0.1 10*3/uL — ABNORMAL HIGH (ref 0.00–0.07)
Basophils Absolute: 0 10*3/uL (ref 0.0–0.1)
Basophils Relative: 0 %
Eosinophils Absolute: 0.1 10*3/uL (ref 0.0–0.5)
Eosinophils Relative: 1 %
HCT: 39.8 % (ref 36.0–46.0)
Hemoglobin: 12.6 g/dL (ref 12.0–15.0)
Immature Granulocytes: 1 %
Lymphocytes Relative: 40 %
Lymphs Abs: 3.6 10*3/uL (ref 0.7–4.0)
MCH: 24 pg — ABNORMAL LOW (ref 26.0–34.0)
MCHC: 31.7 g/dL (ref 30.0–36.0)
MCV: 76 fL — ABNORMAL LOW (ref 80.0–100.0)
Monocytes Absolute: 1.1 10*3/uL — ABNORMAL HIGH (ref 0.1–1.0)
Monocytes Relative: 12 %
Neutro Abs: 4.2 10*3/uL (ref 1.7–7.7)
Neutrophils Relative %: 46 %
Platelets: 238 10*3/uL (ref 150–400)
RBC: 5.24 MIL/uL — ABNORMAL HIGH (ref 3.87–5.11)
RDW: 14.8 % (ref 11.5–15.5)
WBC: 9.1 10*3/uL (ref 4.0–10.5)
nRBC: 0 % (ref 0.0–0.2)

## 2020-06-05 LAB — MAGNESIUM: Magnesium: 1.9 mg/dL (ref 1.7–2.4)

## 2020-06-05 NOTE — Discharge Summary (Signed)
Physician Discharge Summary  Mikayla ParkinsSandra Lunn ZOX:[id number] DOB: 1960/10/19 DOA: 06/01/2020  PCP: Redmond SchoolKilby, Angela C, NP  Admit date: 06/01/2020 Discharge date: 06/05/2020  Admitted From: Home Disposition: Home  Recommendations for Outpatient Follow-up:  1. Follow ups as below. 2. Please obtain CBC/BMP/Mag at follow up 3. 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 Tristan Schroederharles [facility] on 05/30/2020, chronic hypokalemia, elevated liver enzymes, chronic nausea and vomiting, anxiety and depression presenting with altered mental status, nausea and emesis.  CMP significant for hypokalemia to 2.7.  WBC 10.6. UDS was positive for marijuana.  CT head, abdomen and pelvis, and CXR without acute finding.  Patient was admitted for acute toxic metabolic encephalopathy, acute on chronic nausea and vomiting and acute on chronic hypokalemia.   Of note, patient had extensive work-up for elevated LFT and chronic nausea and vomiting including MRI, HIDA scan, CT scan and gastric emptying study without significant finding.  Patient symptoms improved with IV fluid hydration, antiemetics and electrolyte replenishment.  Eventually, she tolerated soft diet and felt well to go home.  She was encouraged to stop using marijuana.  See individual problem list below for more on hospital course.  Discharge Diagnoses:  Acute toxic metabolic encephalopathy: unclear etiology but resolved.  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  Extensive work-up in the past without explanation. Psychogenic?  Marijuana could play a role.  Abdominal exam is benign.  CMP and CT abdomen and pelvis without significant finding other than hypokalemia. Nausea and vomiting resolved.  -Continue Zofran, Protonix, and Carafate. -Counseled to stop marijuana.  Lumbar spinal canal stenosis/cauda equina status post recent lumbar laminectomy on 05/30/1930 at [facility]-no focal neuro symptoms or bowel or bladder issue -Continue as needed pain medications -Outpatient follow-up with a surgeon.  Acute on chronic hypokalemia: Likely due to thiazide diuretics.  She is on chlorthalidone 25 mg twice daily at home.  Resolved. -Reduce chlorthalidone to 25 mg daily. -Continue p.o. potassium -Recheck in 1 week.  Mild hyponatremia: Resolved.  Essential hypertension: Normotensive. -Continue home losartan. -Reduced on chlorthalidone to 25 mg daily. -Continue low-dose metoprolol-she takes this for palpitation.  Anxiety and depression: Stable -Continue home Lexapro and gabapentin   Body mass index is 32.92 kg/m.            Discharge Exam: Vitals:   06/05/20 0554 06/05/20 1052  BP: 107/80 105/77  Pulse: (!) 52 (!) 51  Resp: 19   Temp: 98.1 F (36.7 C)   SpO2: 100%     GENERAL: No apparent distress.  Nontoxic. HEENT: MMM.  Vision and hearing grossly intact.  NECK: Supple.  No apparent JVD.  RESP:  No IWOB.  Fair aeration bilaterally. CVS:  RRR. Heart sounds normal.  ABD/GI/GU: Bowel sounds present. Soft. Non tender.  MSK/EXT:  Moves extremities. No apparent deformity. No edema.  SKIN: no apparent skin lesion or wound NEURO: Awake, alert and oriented appropriately.  No apparent focal neuro deficit. PSYCH: Calm. Normal affect.   Discharge Instructions  Discharge Instructions    Call MD for:  difficulty breathing, headache or visual disturbances   Complete by: As directed    Call MD for:  extreme fatigue   Complete  by: As directed    Call MD for:  persistant dizziness or light-headedness   Complete by: As directed    Call MD  for:  persistant nausea and vomiting   Complete by: As directed    Call MD for:  severe uncontrolled pain   Complete by: As directed    Call MD for:  temperature >100.4   Complete by: As directed    Diet - low sodium heart healthy   Complete by: As directed    Discharge instructions   Complete by: As directed    It has been a pleasure taking care of you!  You were hospitalized due to confusion, nausea and vomiting.  His symptoms improved with hydration and nausea medications to the point we think it is safe to let you go home and follow-up with your primary care doctor.  We strongly recommend you stop using marijuana which could decrease your risk of nausea and vomiting.    We may have started you on other new medications or made some changes to your home medications during this hospitalization. Please review your new medication list and the directions carefully before you take them.    Please go to your hospital follow-up appointments or call to schedule as recommended.   Take care,   Increase activity slowly   Complete by: As directed    No dressing needed   Complete by: As directed      Allergies as of 06/05/2020      Reactions   Morphine Itching   Amlodipine Cough   Clonidine    Other reaction(s): Other (See Comments) Makes her sleepy and very "drugged up" unknown Unable to awake   Tramadol Nausea Only, Nausea And Vomiting   Sulfa Antibiotics    Tetracyclines & Related       Medication List    STOP taking these medications   HYDROmorphone 2 MG tablet Commonly known as: DILAUDID     TAKE these medications   acetaminophen 500 MG tablet Commonly known as: TYLENOL Take 500 mg by mouth every 6 (six) hours as needed for mild pain or headache.   chlorthalidone 25 MG tablet Commonly known as: HYGROTON Take 1 tablet (25 mg total) by mouth daily. What changed: when to take this   Cholecalciferol 50 MCG (2000 UT) Tabs Take 2,000 Units by mouth daily.     escitalopram 20 MG tablet Commonly known as: LEXAPRO Take 20 mg by mouth daily.   fluticasone 50 MCG/ACT nasal spray Commonly known as: FLONASE Place 1 spray into both nostrils daily as needed for allergies.   gabapentin 100 MG capsule Commonly known as: NEURONTIN Take 100-300 mg by mouth See admin instructions.  in the morning and  at bedtime   losartan 100 MG tablet Commonly known as: COZAAR Take 100 mg by mouth daily.   metoprolol succinate 25 MG 24 hr tablet Commonly known as: TOPROL-XL Take 12.5-25 mg by mouth daily. Pt has palpitations if 12.5mg  doesn't work take another 12.5mg    ondansetron 4 MG tablet Commonly known as: Zofran Take 1 tablet (4 mg total) by mouth every 8 (eight) hours as needed for up to 7 days for nausea or vomiting.   pantoprazole 40 MG tablet Commonly known as: PROTONIX Take 40 mg by mouth daily.   polyvinyl alcohol 1.4 % ophthalmic solution Commonly known as: LIQUIFILM TEARS Place 1 drop into both eyes as needed for dry eyes.   Potassium Chloride ER 20 MEQ Tbcr Take 40 mEq by mouth  daily.   sucralfate 1 g tablet Commonly known as: CARAFATE Take 1 tablet (1 g total) by mouth 4 (four) times daily -  with meals and at bedtime.            Discharge Care Instructions  (From admission, onward)         Start     Ordered   06/04/20 0000  No dressing needed        06/04/20 1147          Consultations:  None  Procedures/Studies:   DG Chest 2 View  Result Date: 06/01/2020 CLINICAL DATA:  Generalized abdominal pain and vomiting. EXAM: CHEST - 2 VIEW COMPARISON:  May 11, 2019 FINDINGS: There is no evidence of acute infiltrate, pleural effusion or pneumothorax. The heart size and mediastinal contours are within normal limits. The visualized skeletal structures are unremarkable. IMPRESSION: No active cardiopulmonary disease. Electronically Signed   By: Aram Candela M.D.   On: 06/01/2020 20:19   CT Head Wo  Contrast  Result Date: 06/01/2020 CLINICAL DATA:  Mental status change, unknown cause Patient reports back surgery 2 days ago. EXAM: CT HEAD WITHOUT CONTRAST TECHNIQUE: Contiguous axial images were obtained from the base of the skull through the vertex without intravenous contrast. COMPARISON:  Head CT 07/06/2016 FINDINGS: Brain: Brain volume is normal for age. No intracranial hemorrhage, mass effect, or midline shift. No hydrocephalus. The basilar cisterns are patent. No evidence of territorial infarct or acute ischemia. No extra-axial or intracranial fluid collection. Vascular: No hyperdense vessel or unexpected calcification. Skull: No fracture or focal lesion. Sinuses/Orbits: Frontal sinuses are hypo pneumatized. No acute findings. Mastoid air cells are clear. Included orbits are unremarkable. Other: None. IMPRESSION: Negative noncontrast head CT. Electronically Signed   By: Narda Rutherford M.D.   On: 06/01/2020 22:41   CT ABDOMEN PELVIS W CONTRAST  Result Date: 06/02/2020 CLINICAL DATA:  Epigastric pain. Diarrhea, nausea, vomiting for 2 days. EXAM: CT ABDOMEN AND PELVIS WITH CONTRAST TECHNIQUE: Multidetector CT imaging of the abdomen and pelvis was performed using the standard protocol following bolus administration of intravenous contrast. CONTRAST:  OMNIPAQUE IOHEXOL 300 MG/ML  SOLN COMPARISON:  04/22/2017 FINDINGS: Lower chest: No acute abnormality. Hepatobiliary: No focal liver abnormality is seen. No gallstones, gallbladder wall thickening, or biliary dilatation. Pancreas: Unremarkable. No pancreatic ductal dilatation or surrounding inflammatory changes. Spleen: Normal in size without focal abnormality. Adrenals/Urinary Tract: Adrenal glands are unremarkable. Kidneys are normal, without renal calculi, focal lesion, or hydronephrosis. Bladder is unremarkable. Stomach/Bowel: The stomach appears nondistended. No small bowel wall thickening, inflammation or distension. The appendix is visualized  and appears normal. Scattered colonic diverticula identified. No acute inflammation. Vascular/Lymphatic: No significant vascular findings are present. No enlarged abdominal or pelvic lymph nodes. Reproductive: Status post hysterectomy. No adnexal masses. Other: There is a small volume of fluid identified within the posterior pelvis. Musculoskeletal: No acute or significant osseous findings. Mild degenerative changes noted throughout the lumbar spine. IMPRESSION: 1. No acute findings identified within the abdomen or pelvis. 2. Small volume of fluid identified within the posterior pelvis. Electronically Signed   By: Signa Kell M.D.   On: 06/02/2020 09:32        The results of significant diagnostics from this hospitalization (including imaging, microbiology, ancillary and laboratory) are listed below for reference.     Microbiology: Recent Results (from the past 240 hour(s))  SARS Coronavirus 2 by RT PCR (hospital order, performed in [facility] hospital lab) Nasopharyngeal Nasopharyngeal Swab  Status: None   Collection Time: 06/01/20  8:16 PM   Specimen: Nasopharyngeal Swab  Result Value Ref Range Status   SARS Coronavirus 2 NEGATIVE NEGATIVE Final    Comment: (NOTE) SARS-CoV-2 target nucleic acids are NOT DETECTED.  The SARS-CoV-2 RNA is generally detectable in upper and lower respiratory specimens during the acute phase of infection. The lowest concentration of SARS-CoV-2 viral copies this assay can detect is 250 copies / mL. A negative result does not preclude SARS-CoV-2 infection and should not be used as the sole basis for treatment or other patient management decisions.  A negative result may occur with improper specimen collection / handling, submission of specimen other than nasopharyngeal swab, presence of viral mutation(s) within the areas targeted by this assay, and inadequate number of viral copies (<250 copies / mL). A negative result must be combined with  clinical observations, patient history, and epidemiological information.  Fact Sheet for Patients:   BoilerBrush.com.cy  Fact Sheet for Healthcare Providers: https://pope.com/  This test is not yet approved or  cleared by the [place] FDA and has been authorized for detection and/or diagnosis of SARS-CoV-2 by FDA under an Emergency Use Authorization (EUA).  This EUA will remain in effect (meaning this test can be used) for the duration of the COVID-19 declaration under Section 564(b)(1) of the Act, 21 U.S.C. section 360bbb-3(b)(1), unless the authorization is terminated or revoked sooner.  Performed at [facility], [address] [place], [place] [address]      Labs: BNP (last 3 results) No results for input(s): BNP in the last 8760 hours. Basic Metabolic Panel: Recent Labs  Lab 06/01/20 1718 06/01/20 2021 06/02/20 0531 06/02/20 1043 06/04/20 0423 06/05/20 0448  NA 137  --  135 135 133* 137  K 2.7*  --  2.7* 3.1* 3.2* 4.0  CL 97*  --  98 99 96* 103  CO2 24  --  26 24 26 27   GLUCOSE 143*  --  120* 104* 97 94  BUN 8  --  7 8 14 18   CREATININE 0.60  --  0.53 0.61 0.69 0.93  CALCIUM 10.2  --  9.3 9.3 9.3 9.0  MG  --  1.3* 2.0 1.9 2.0 1.9   Liver Function Tests: Recent Labs  Lab 06/01/20 1718 06/02/20 0531 06/02/20 1043 06/04/20 0423 06/05/20 0448  AST 29 24 24 18 17   ALT 18 16 18 16 15   ALKPHOS 83 75 79 71 65  BILITOT 0.8 0.7 0.9 1.2 1.0  PROT 8.3* 7.5 7.8 7.2 6.3*  ALBUMIN 4.6 4.0 4.0 3.8 3.5   Recent Labs  Lab 06/01/20 1718  LIPASE 21   Recent Labs  Lab 06/01/20 2021  AMMONIA 23   CBC: Recent Labs  Lab 06/01/20 1718 06/02/20 0531 06/02/20 1043 06/04/20 0423 06/05/20 0448  WBC 8.7 10.6* 10.1 10.7* 9.1  NEUTROABS  --  7.3 7.2 6.6 4.2  HGB 13.8 13.0 13.6 14.0 12.6  HCT 43.8 40.4 41.6 43.9 39.8  MCV 74.4* 73.7* 73.5* 74.2* 76.0*  PLT 257 252 250 274 238   Cardiac Enzymes: No  results for input(s): CKTOTAL, CKMB, CKMBINDEX, TROPONINI in the last 168 hours. BNP: Invalid input(s): POCBNP CBG: No results for input(s): GLUCAP in the last 168 hours. D-Dimer No results for input(s): DDIMER in the last 72 hours. Hgb A1c No results for input(s): HGBA1C in the last 72 hours. Lipid Profile No results for input(s): CHOL, HDL, LDLCALC, TRIG, CHOLHDL, LDLDIRECT in the last  72 hours. Thyroid function studies No results for input(s): TSH, T4TOTAL, T3FREE, THYROIDAB in the last 72 hours.  Invalid input(s): FREET3 Anemia work up No results for input(s): VITAMINB12, FOLATE, FERRITIN, TIBC, IRON, RETICCTPCT in the last 72 hours. Urinalysis    Component Value Date/Time   COLORURINE YELLOW 06/01/2020 2218   APPEARANCEUR CLEAR 06/01/2020 2218   LABSPEC 1.025 06/01/2020 2218   PHURINE 7.5 06/01/2020 2218   GLUCOSEU NEGATIVE 06/01/2020 2218   HGBUR MODERATE (A) 06/01/2020 2218   BILIRUBINUR NEGATIVE 06/01/2020 2218   KETONESUR 40 (A) 06/01/2020 2218   PROTEINUR 100 (A) 06/01/2020 2218   NITRITE NEGATIVE 06/01/2020 2218   LEUKOCYTESUR NEGATIVE 06/01/2020 2218   Sepsis Labs Invalid input(s): PROCALCITONIN,  WBC,  LACTICIDVEN   Time coordinating discharge: 35 minutes  SIGNED:  Almon Hercules, MD  [facility] 06/05/2020, 3:30 PM  If 7PM-7AM, please contact night-coverage [email or web address]

## 2020-08-13 DIAGNOSIS — M4310 Spondylolisthesis, site unspecified: Secondary | ICD-10-CM

## 2020-08-13 HISTORY — DX: Spondylolisthesis, site unspecified: M43.10

## 2020-10-28 DIAGNOSIS — Z981 Arthrodesis status: Secondary | ICD-10-CM | POA: Insufficient documentation

## 2020-10-28 HISTORY — DX: Arthrodesis status: Z98.1

## 2020-11-06 ENCOUNTER — Emergency Department (HOSPITAL_COMMUNITY): Payer: BC Managed Care – PPO

## 2020-11-06 ENCOUNTER — Other Ambulatory Visit: Payer: Self-pay

## 2020-11-06 ENCOUNTER — Encounter (HOSPITAL_COMMUNITY): Payer: Self-pay | Admitting: Emergency Medicine

## 2020-11-06 ENCOUNTER — Emergency Department (HOSPITAL_COMMUNITY)
Admission: EM | Admit: 2020-11-06 | Discharge: 2020-11-07 | Disposition: A | Discharge status: Home / Self Care | Payer: BC Managed Care – PPO | Attending: Emergency Medicine | Admitting: Emergency Medicine

## 2020-11-06 DIAGNOSIS — Z79899 Other long term (current) drug therapy: Secondary | ICD-10-CM | POA: Insufficient documentation

## 2020-11-06 DIAGNOSIS — R4689 Other symptoms and signs involving appearance and behavior: Secondary | ICD-10-CM

## 2020-11-06 DIAGNOSIS — I10 Essential (primary) hypertension: Secondary | ICD-10-CM | POA: Diagnosis not present

## 2020-11-06 DIAGNOSIS — R1084 Generalized abdominal pain: Secondary | ICD-10-CM | POA: Diagnosis not present

## 2020-11-06 DIAGNOSIS — R410 Disorientation, unspecified: Secondary | ICD-10-CM | POA: Insufficient documentation

## 2020-11-06 DIAGNOSIS — R112 Nausea with vomiting, unspecified: Secondary | ICD-10-CM

## 2020-11-06 DIAGNOSIS — E860 Dehydration: Secondary | ICD-10-CM

## 2020-11-06 DIAGNOSIS — Z87891 Personal history of nicotine dependence: Secondary | ICD-10-CM | POA: Diagnosis not present

## 2020-11-06 DIAGNOSIS — R1110 Vomiting, unspecified: Secondary | ICD-10-CM | POA: Diagnosis not present

## 2020-11-06 DIAGNOSIS — Z20822 Contact with and (suspected) exposure to covid-19: Secondary | ICD-10-CM | POA: Insufficient documentation

## 2020-11-06 LAB — SARS CORONAVIRUS 2 BY RT PCR (HOSPITAL ORDER, PERFORMED IN ~~LOC~~ HOSPITAL LAB): SARS Coronavirus 2: NEGATIVE

## 2020-11-06 LAB — COMPREHENSIVE METABOLIC PANEL
ALT: 16 U/L (ref 0–44)
AST: 35 U/L (ref 15–41)
Albumin: 5.1 g/dL — ABNORMAL HIGH (ref 3.5–5.0)
Alkaline Phosphatase: 100 U/L (ref 38–126)
Anion gap: 17 — ABNORMAL HIGH (ref 5–15)
BUN: 9 mg/dL (ref 6–20)
CO2: 23 mmol/L (ref 22–32)
Calcium: 10.5 mg/dL — ABNORMAL HIGH (ref 8.9–10.3)
Chloride: 99 mmol/L (ref 98–111)
Creatinine, Ser: 0.81 mg/dL (ref 0.44–1.00)
GFR, Estimated: >60 mL/min (ref >60)
Glucose, Bld: 145 mg/dL — ABNORMAL HIGH (ref 70–99)
Potassium: 4 mmol/L (ref 3.5–5.1)
Sodium: 139 mmol/L (ref 135–145)
Total Bilirubin: 1 mg/dL (ref 0.3–1.2)
Total Protein: 9.3 g/dL — ABNORMAL HIGH (ref 6.5–8.1)

## 2020-11-06 LAB — ETHANOL: Alcohol, Ethyl (B): <10 mg/dL (ref <10)

## 2020-11-06 LAB — CBC
HCT: 42.9 % (ref 36.0–46.0)
Hemoglobin: 13.8 g/dL (ref 12.0–15.0)
MCH: 23.9 pg — ABNORMAL LOW (ref 26.0–34.0)
MCHC: 32.2 g/dL (ref 30.0–36.0)
MCV: 74.2 fL — ABNORMAL LOW (ref 80.0–100.0)
Platelets: 236 10*3/uL (ref 150–400)
RBC: 5.78 MIL/uL — ABNORMAL HIGH (ref 3.87–5.11)
RDW: 15 % (ref 11.5–15.5)
WBC: 6.8 10*3/uL (ref 4.0–10.5)
nRBC: 0 % (ref 0.0–0.2)

## 2020-11-06 LAB — LIPASE, BLOOD: Lipase: 23 U/L (ref 11–51)

## 2020-11-06 LAB — MAGNESIUM: Magnesium: 1.9 mg/dL (ref 1.7–2.4)

## 2020-11-06 LAB — AMMONIA: Ammonia: 24 umol/L (ref 9–35)

## 2020-11-06 MED ORDER — ONDANSETRON HCL 4 MG/2ML IJ SOLN
4.0000 mg | Freq: Once | INTRAMUSCULAR | Status: AC
  Administered 2020-11-06: 4 mg via INTRAVENOUS
  Filled 2020-11-06: qty 2

## 2020-11-06 MED ORDER — FENTANYL CITRATE (PF) 100 MCG/2ML IJ SOLN
50.0000 ug | Freq: Once | INTRAMUSCULAR | Status: AC
Start: 2020-11-06 — End: 2020-11-06
  Administered 2020-11-06: 50 ug via INTRAVENOUS
  Filled 2020-11-06: qty 2

## 2020-11-06 MED ORDER — IOHEXOL 300 MG/ML  SOLN
100.0000 mL | Freq: Once | INTRAMUSCULAR | Status: AC | PRN
  Administered 2020-11-06: 100 mL via INTRAVENOUS

## 2020-11-06 MED ORDER — SODIUM CHLORIDE 0.9 % IV SOLN
1000.0000 mL | INTRAVENOUS | Status: DC
  Administered 2020-11-06 – 2020-11-07 (×2): 1000 mL via INTRAVENOUS

## 2020-11-06 MED ORDER — SODIUM CHLORIDE 0.9 % IV BOLUS (SEPSIS)
1000.0000 mL | Freq: Once | INTRAVENOUS | Status: AC
  Administered 2020-11-06: 1000 mL via INTRAVENOUS

## 2020-11-06 NOTE — ED Provider Notes (Addendum)
Nursing notes and vitals signs, including pulse oximetry, reviewed.  Summary of this visit's results, reviewed by myself:  EKG:  EKG Interpretation  Date/Time:    Ventricular Rate:    PR Interval:    QRS Duration:   QT Interval:    QTC Calculation:   R Axis:     Text Interpretation:         Labs:  Results for orders placed or performed during the hospital encounter of 11/06/20 (from the past 24 hour(s))  D-dimer, quantitative (not at [facility])     Status: Abnormal   Collection Time: 11/06/20  8:49 PM  Result Value Ref Range   D-Dimer, Quant 3.27 (H) 0.00 - 0.50 ug/mL-FEU  Ammonia     Status: None   Collection Time: 11/06/20  8:51 PM  Result Value Ref Range   Ammonia 24 9 - 35 umol/L  SARS Coronavirus 2 by RT PCR (hospital order, performed in [facility] hospital lab) Nasopharyngeal Nasopharyngeal Swab     Status: None   Collection Time: 11/06/20  8:51 PM   Specimen: Nasopharyngeal Swab  Result Value Ref Range   SARS Coronavirus 2 NEGATIVE NEGATIVE  Lipase, blood     Status: None   Collection Time: 11/06/20  9:00 PM  Result Value Ref Range   Lipase 23 11 - 51 U/L  Comprehensive metabolic panel     Status: Abnormal   Collection Time: 11/06/20  9:00 PM  Result Value Ref Range   Sodium 139 135 - 145 mmol/L   Potassium 4.0 3.5 - 5.1 mmol/L   Chloride 99 98 - 111 mmol/L   CO2 23 22 - 32 mmol/L   Glucose, Bld 145 (H) 70 - 99 mg/dL   BUN 9 6 - 20 mg/dL   Creatinine, Ser 9.56 0.44 - 1.00 mg/dL   Calcium 21.3 (H) 8.9 - 10.3 mg/dL   Total Protein 9.3 (H) 6.5 - 8.1 g/dL   Albumin 5.1 (H) 3.5 - 5.0 g/dL   AST 35 15 - 41 U/L   ALT 16 0 - 44 U/L   Alkaline Phosphatase 100 38 - 126 U/L   Total Bilirubin 1.0 0.3 - 1.2 mg/dL   GFR, Estimated >08 >65 mL/min   Anion gap 17 (H) 5 - 15  CBC     Status: Abnormal   Collection Time: 11/06/20  9:00 PM  Result Value Ref Range   WBC 6.8 4.0 - 10.5 K/uL   RBC 5.78 (H) 3.87 - 5.11 MIL/uL   Hemoglobin 13.8 12.0 - 15.0 g/dL   HCT 78.4 69.6  - 29.5 %   MCV 74.2 (L) 80.0 - 100.0 fL   MCH 23.9 (L) 26.0 - 34.0 pg   MCHC 32.2 30.0 - 36.0 g/dL   RDW 28.4 13.2 - 44.0 %   Platelets 236 150 - 400 K/uL   nRBC 0.0 0.0 - 0.2 %  Ethanol     Status: None   Collection Time: 11/06/20  9:00 PM  Result Value Ref Range   Alcohol, Ethyl (B) <10 <10 mg/dL  Magnesium     Status: None   Collection Time: 11/06/20  9:00 PM  Result Value Ref Range   Magnesium 1.9 1.7 - 2.4 mg/dL    Imaging Studies: DG Chest 2 View  Result Date: 11/07/2020 CLINICAL DATA:  Tachypnea EXAM: CHEST - 2 VIEW COMPARISON:  06/01/2020 FINDINGS: Cardiac shadow is within normal limits. The lungs are well aerated bilaterally. No focal infiltrate or sizable effusion is seen. No bony  abnormality is noted. IMPRESSION: No active cardiopulmonary disease. Electronically Signed   By: Alcide Clever M.D.   On: 11/07/2020 00:27   CT Head Wo Contrast  Result Date: 11/06/2020 CLINICAL DATA:  Altered mental status EXAM: CT HEAD WITHOUT CONTRAST TECHNIQUE: Contiguous axial images were obtained from the base of the skull through the vertex without intravenous contrast. COMPARISON:  CT 07/06/2016 FINDINGS: Brain: Motion degradation of the exam requiring multiple attempts at acquisition. No evidence of acute infarction, hemorrhage, hydrocephalus, extra-axial collection, visible mass lesion or mass effect. Vascular: No hyperdense vessel or unexpected calcification. Skull: No calvarial fracture or suspicious osseous lesion. No scalp swelling or hematoma. Sinuses/Orbits: Paranasal sinuses and mastoid air cells are predominantly clear. Included orbital structures are unremarkable. Other: None. IMPRESSION: 1. Motion degradation of the exam requiring multiple attempts at acquisition. 2. No acute intracranial abnormality. Electronically Signed   By: Kreg Shropshire M.D.   On: 11/06/2020 22:59   CT Angio Chest PE W and/or Wo Contrast  Result Date: 11/07/2020 CLINICAL DATA:  Elevated D-dimer.  Abdominal pain.  EXAM: CT ANGIOGRAPHY CHEST WITH CONTRAST TECHNIQUE: Multidetector CT imaging of the chest was performed using the standard protocol during bolus administration of intravenous contrast. Multiplanar CT image reconstructions and MIPs were obtained to evaluate the vascular anatomy. CONTRAST:  37mL OMNIPAQUE IOHEXOL 350 MG/ML SOLN COMPARISON:  05/18/2019 FINDINGS: Cardiovascular: Contrast injection is sufficient to demonstrate satisfactory opacification of the pulmonary arteries to the segmental level. There is no pulmonary embolus or evidence of right heart strain. The size of the main pulmonary artery is normal. Heart size is normal, with no pericardial effusion. The course and caliber of the aorta are normal. There is no atherosclerotic calcification. Opacification decreased due to pulmonary arterial phase contrast bolus timing. Mediastinum/Nodes: No mediastinal, hilar or axillary lymphadenopathy. Normal visualized thyroid. Thoracic esophageal course is normal. Lungs/Pleura: Airways are patent. No pleural effusion, lobar consolidation, pneumothorax or pulmonary infarction. Upper Abdomen: Contrast bolus timing is not optimized for evaluation of the abdominal organs. The visualized portions of the organs of the upper abdomen are normal. Musculoskeletal: No chest wall abnormality. No bony spinal canal stenosis. Review of the MIP images confirms the above findings. IMPRESSION: No pulmonary embolus or other acute thoracic abnormality. Electronically Signed   By: Deatra Robinson M.D.   On: 11/07/2020 03:33   CT ABDOMEN PELVIS W CONTRAST  Result Date: 11/06/2020 CLINICAL DATA:  Abdominal pain. EXAM: CT ABDOMEN AND PELVIS WITH CONTRAST TECHNIQUE: Multidetector CT imaging of the abdomen and pelvis was performed using the standard protocol following bolus administration of intravenous contrast. CONTRAST:  OMNIPAQUE IOHEXOL 300 MG/ML  SOLN COMPARISON:  06/02/2020 FINDINGS: Lower chest: The lung bases are clear. The heart  size is normal. Hepatobiliary: The liver is normal. Normal gallbladder.There is no biliary ductal dilation. Pancreas: Normal contours without ductal dilatation. No peripancreatic fluid collection. Spleen: Unremarkable. Adrenals/Urinary Tract: --Adrenal glands: Unremarkable. --Right kidney/ureter: No hydronephrosis or radiopaque kidney stones. --Left kidney/ureter: No hydronephrosis or radiopaque kidney stones. --Urinary bladder: Unremarkable. Stomach/Bowel: --Stomach/Duodenum: No hiatal hernia or other gastric abnormality. Normal duodenal course and caliber. --Small bowel: Unremarkable. --Colon: Unremarkable. --Appendix: Not visualized. No right lower quadrant inflammation or free fluid. Vascular/Lymphatic: Atherosclerotic calcification is present within the non-aneurysmal abdominal aorta, without hemodynamically significant stenosis. --No retroperitoneal lymphadenopathy. --No mesenteric lymphadenopathy. --No pelvic or inguinal lymphadenopathy. Reproductive: Status post hysterectomy. No adnexal mass. Other: No ascites or free air. The abdominal wall is normal. Musculoskeletal. No acute displaced fractures. IMPRESSION: 1. Examination is limited by  significant motion artifact. 2. No acute abnormality. 3.  Aortic Atherosclerosis (ICD10-I70.0). Electronically Signed   By: Katherine Mantle M.D.   On: 11/06/2020 23:00   11:39 PM Patient downplaying her left lower quadrant pain.  Husband states she is still acting somewhat confused.  The patient's nurse note she is tachypneic and her oxygen saturation is low at 88%, although this may be due to a recently administer dose of fentanyl.  Will obtain a chest x-ray and a D-dimer.    4:01 AM Patient's chest CT unremarkable.  Drug screen is positive only for THC.  Urinalysis shows hematuria but the patient states she has chronic hematuria of undetermined etiology.  She is is pain-free at this time her abdomen is soft and nontender and she would like to be discharged home.   She awake, alert and appropriate.  4:35 AM The patient's nurse went to discharge her and she began showing signs of abnormal behavior.  Specifically she was hallucinating that "things" were in her bed.  She had earlier acted like she needed assistance to ambulate to the bathroom but later another staff member found her ambulating on her own without difficulty.  We will have TTS assess her.   Rad Gramling, Jonny Ruiz, MD 11/07/20 0404    Paula Libra, MD 11/07/20 [phone]

## 2020-11-06 NOTE — ED Notes (Signed)
Attempted lab draw with no success.  

## 2020-11-06 NOTE — ED Triage Notes (Signed)
Per pt, states abdominal pain and vomiting since this am-answering all questions appropriately-husband thinks she is dehydrated

## 2020-11-06 NOTE — ED Provider Notes (Signed)
[facility]-EMERGENCY DEPT Provider Note   CSN: [id number] Arrival date & time: 11/06/20  1632     History Chief Complaint  Patient presents with  . Abdominal Pain  . Emesis    Mikayla Hart is a 60 y.o. female.  HPI    Pt states she started vomiting this am around 0730.  She vomited numerous times.   No diarrhea.  Soft stools.  Not been able to keep down any fluids or fluids.  Patient denies any fevers.  She is having abdominal pain that is diffuse.  At times the patient's answers are tangential.  When initially asked what is bothering her patient stated "just everything, what do you call it"  When asked to elaborate, pt stated" you now, the masters of the universe."  Past Medical History:  Diagnosis Date  . Hypertension     Patient Active Problem List   Diagnosis Date Noted  . Hypokalemia 06/02/2020  . Hypomagnesemia 06/02/2020  . Non-intractable vomiting 06/02/2020  . Essential hypertension 06/02/2020  . Acute metabolic encephalopathy 06/02/2020  . Acute encephalopathy 06/01/2020    Past Surgical History:  Procedure Laterality Date  . ABDOMINAL HYSTERECTOMY    . BACK SURGERY    . ORTHOPEDIC SURGERY       OB History   No obstetric history on file.     Family History  Family history unknown: Yes    Social History   Tobacco Use  . Smoking status: Former Games developer  . Smokeless tobacco: Never Used  Substance Use Topics  . Alcohol use: Yes  . Drug use: No    Home Medications Prior to Admission medications   Medication Sig Start Date End Date Taking? Authorizing Provider  acetaminophen (TYLENOL) 500 MG tablet Take 500 mg by mouth every 6 (six) hours as needed for mild pain or headache.    [provider]  chlorthalidone (HYGROTON) 25 MG tablet Take 1 tablet (25 mg total) by mouth daily. 06/04/20   Almon Hercules, MD  Cholecalciferol 50 MCG (2000 UT) TABS Take 2,000 Units by mouth daily.  07/07/17   [provider]   escitalopram (LEXAPRO) 20 MG tablet Take 20 mg by mouth daily. 03/08/20   [provider]  fluticasone (FLONASE) 50 MCG/ACT nasal spray Place 1 spray into both nostrils daily as needed for allergies.    [provider]  gabapentin (NEURONTIN) 100 MG capsule Take 100-300 mg by mouth See admin instructions. 100mg  in the morning and 300mg  at bedtime 05/02/20   [provider]  losartan (COZAAR) 100 MG tablet Take 100 mg by mouth daily.  03/16/19   [provider]  metoprolol succinate (TOPROL-XL) 25 MG 24 hr tablet Take 12.5-25 mg by mouth daily. Pt has palpitations if 12.5mg  doesn't work take another 12.5mg  02/20/20   [provider]  pantoprazole (PROTONIX) 40 MG tablet Take 40 mg by mouth daily. 04/30/20   [provider]  polyvinyl alcohol (LIQUIFILM TEARS) 1.4 % ophthalmic solution Place 1 drop into both eyes as needed for dry eyes.    [provider]  Potassium Chloride ER 20 MEQ TBCR Take 40 mEq by mouth daily.  04/25/20   [provider]  sucralfate (CARAFATE) 1 g tablet Take 1 tablet (1 g total) by mouth 4 (four) times daily -  with meals and at bedtime. 06/04/20   04/27/20, MD    Allergies    Morphine, Amlodipine, Clonidine, Tramadol, Sulfa antibiotics, and Tetracyclines & related  Review of Systems   Review of Systems  All other systems reviewed and are negative.   Physical Exam Updated Vital Signs BP (!) 160/135   Pulse 61   Temp 97.8 F (36.6 C) (Oral)   Resp 16   SpO2 100%   Physical Exam Vitals and nursing note reviewed.  Constitutional:      General: She is not in acute distress.    Appearance: She is well-developed and well-nourished. She is ill-appearing.  HENT:     Head: Normocephalic and atraumatic.     Right Ear: External ear normal.     Left Ear: External ear normal.  Eyes:     General: No scleral icterus.       Right eye: No discharge.        Left eye: No discharge.      Conjunctiva/sclera: Conjunctivae normal.  Neck:     Trachea: No tracheal deviation.  Cardiovascular:     Rate and Rhythm: Normal rate and regular rhythm.     Pulses: Intact distal pulses.  Pulmonary:     Effort: Pulmonary effort is normal. No respiratory distress.     Breath sounds: Normal breath sounds. No stridor. No wheezing or rales.  Abdominal:     General: Bowel sounds are normal. There is no distension.     Palpations: Abdomen is soft.     Tenderness: There is generalized abdominal tenderness. There is no guarding or rebound.  Musculoskeletal:        General: No tenderness or edema.     Cervical back: Neck supple.  Skin:    General: Skin is warm and dry.     Findings: No rash.  Neurological:     Mental Status: She is alert.     Cranial Nerves: No cranial nerve deficit (no facial droop, extraocular movements intact, no slurred speech).     Sensory: No sensory deficit.     Motor: No abnormal muscle tone or seizure activity.     Coordination: Coordination normal.     Deep Tendon Reflexes: Strength normal.  Psychiatric:        Mood and Affect: Mood and affect normal.     ED Results / Procedures / Treatments   Labs (all labs ordered are listed, but only abnormal results are displayed) Labs Reviewed  COMPREHENSIVE METABOLIC PANEL - Abnormal; Notable for the following components:      Result Value   Glucose, Bld 145 (*)    Calcium 10.5 (*)    Total Protein 9.3 (*)    Albumin 5.1 (*)    Anion gap 17 (*)    All other components within normal limits  CBC - Abnormal; Notable for the following components:   RBC 5.78 (*)    MCV 74.2 (*)    MCH 23.9 (*)    All other components within normal limits  SARS CORONAVIRUS 2 BY RT PCR (HOSPITAL ORDER, PERFORMED IN [facility])  LIPASE, BLOOD  AMMONIA  ETHANOL  MAGNESIUM  URINALYSIS, ROUTINE W REFLEX MICROSCOPIC  RAPID URINE DRUG SCREEN, HOSP PERFORMED    EKG None  Radiology No results  found.  Procedures Procedures   Medications Ordered in ED Medications  fentaNYL (SUBLIMAZE) injection 50 mcg (has no administration in time range)  ondansetron (ZOFRAN) injection 4 mg (has no administration in time range)  sodium chloride 0.9 % bolus 1,000 mL (has no administration in time range)    Followed by  0.9 %  sodium chloride infusion (has no  administration in time range)  iohexol (OMNIPAQUE) 300 MG/ML solution 100 mL (100 mLs Intravenous Contrast Given 11/06/20 2230)    ED Course  I have reviewed the triage vital signs and the nursing notes.  Pertinent labs & imaging results that were available during my care of the patient were reviewed by me and considered in my medical decision making (see chart for details).  Clinical Course as of 11/06/20 2241  Thu Nov 06, 2020  0272 Alert and oriented to person place and time [JK]  2013 Previous records reviewed.  Patient was at another medical facility for non-intractable vomiting with nausea last month [JK]  2203 Patient's ammonia level is normal.  White blood cell count is normal.  Patient having persistent abdominal tenderness.  Will CT [JK]  2218 Husband states patient still acting confused.  Calcium level is elevated but her total protein level is elevated.  Doubt that this would be the cause of her confusion. [JK]    Clinical Course User Index [JK] Linwood Dibbles, MD   MDM Rules/Calculators/A&P                         Patient presents to the ED for evaluation of abdominal pain vomiting and confusion.  Patient's husband is here and states she is not acting like herself.  She is making bizarre statements.  He stated this happened before when her potassium level was low.  Patient's electrolytes so far are unremarkable.  She does have hypocalcemia but I do not think this is severe enough to cause mental status changes.  CBC is normal.  Magnesium and lipase are normal.  Ammonia levels normal.  Urinalysis and UDS have also been ordered.  I  have ordered a CT scan of her head abdomen and pelvis.  Blood pressure is elevated this point.  I have ordered pain medications IV fluids and antiemetics.  Care will be turned over to Dr [facility] Final Clinical Impression(s) / ED Diagnoses pending   Linwood Dibbles, MD 11/06/20 2243

## 2020-11-07 ENCOUNTER — Emergency Department (HOSPITAL_COMMUNITY): Payer: BC Managed Care – PPO

## 2020-11-07 ENCOUNTER — Encounter (HOSPITAL_COMMUNITY): Payer: Self-pay

## 2020-11-07 LAB — URINALYSIS, ROUTINE W REFLEX MICROSCOPIC
Bacteria, UA: NONE SEEN
Bilirubin Urine: NEGATIVE
Glucose, UA: NEGATIVE mg/dL
Ketones, ur: 80 mg/dL — AB
Leukocytes,Ua: NEGATIVE
Nitrite: NEGATIVE
Protein, ur: >=300 mg/dL — AB
RBC / HPF: >50 RBC/hpf — ABNORMAL HIGH (ref 0–5)
Specific Gravity, Urine: >1.046 — ABNORMAL HIGH (ref 1.005–1.030)
pH: 8 (ref 5.0–8.0)

## 2020-11-07 LAB — D-DIMER, QUANTITATIVE: D-Dimer, Quant: 3.27 ug/mL-FEU — ABNORMAL HIGH (ref 0.00–0.50)

## 2020-11-07 LAB — RAPID URINE DRUG SCREEN, HOSP PERFORMED
Amphetamines: NOT DETECTED
Barbiturates: NOT DETECTED
Benzodiazepines: NOT DETECTED
Cocaine: NOT DETECTED
Opiates: NOT DETECTED
Tetrahydrocannabinol: POSITIVE — AB

## 2020-11-07 MED ORDER — METOPROLOL SUCCINATE ER 25 MG PO TB24
12.5000 mg | ORAL_TABLET | Freq: Every day | ORAL | Status: DC
Start: 2020-11-07 — End: 2020-11-07

## 2020-11-07 MED ORDER — PANTOPRAZOLE SODIUM 40 MG PO TBEC: 40.0000 mg | DELAYED_RELEASE_TABLET | Freq: Every day | ORAL | Status: DC

## 2020-11-07 MED ORDER — IOHEXOL 350 MG/ML SOLN
80.0000 mL | Freq: Once | INTRAVENOUS | Status: AC | PRN
  Administered 2020-11-07: 80 mL via INTRAVENOUS

## 2020-11-07 MED ORDER — ONDANSETRON 8 MG PO TBDP: 8.0000 mg | ORAL_TABLET | Freq: Three times a day (TID) | ORAL | 0 refills | Status: DC | PRN

## 2020-11-07 MED ORDER — CHLORTHALIDONE 25 MG PO TABS: 25.0000 mg | ORAL_TABLET | Freq: Every day | ORAL | Status: DC

## 2020-11-07 MED ORDER — SODIUM CHLORIDE 0.9 % IV BOLUS
1000.0000 mL | Freq: Once | INTRAVENOUS | Status: AC
  Administered 2020-11-07: 1000 mL via INTRAVENOUS

## 2020-11-07 MED ORDER — ESCITALOPRAM OXALATE 10 MG PO TABS: 20.0000 mg | ORAL_TABLET | Freq: Every day | ORAL | Status: DC

## 2020-11-07 MED ORDER — POTASSIUM CHLORIDE ER 20 MEQ PO TBCR: 40.0000 meq | EXTENDED_RELEASE_TABLET | Freq: Every day | ORAL | Status: DC

## 2020-11-07 MED ORDER — LOSARTAN POTASSIUM 25 MG PO TABS: 100.0000 mg | ORAL_TABLET | Freq: Every day | ORAL | Status: DC

## 2020-11-07 NOTE — ED Notes (Signed)
Pt found standing in doorway asking for water and stating she can barely walk.  Pt helped back to bed by NT w/ moderate assistance.  As NT entered to provide water, Pt noted to have gotten out of bed, walked to sink, and drinking out of the sink.  Pt assisted back to bed w/ stand-by assist.

## 2020-11-07 NOTE — ED Notes (Signed)
Pt called husband at this time for a ride home.

## 2020-11-07 NOTE — ED Notes (Signed)
This RN walked into room to speak to Pt about being discharged.  Pt started moving sheets and stating "what's in this bed."  Nothing noted in the bed.  EDP made aware of possible hallucinations.  Additionally, Pt seems to have mild flight of thoughts.
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
## 2020-11-07 NOTE — BH Assessment (Signed)
Clinician ready to see patient for teleassessment.  Called in at 06:001 and 06:05.  No answer yet.

## 2020-11-07 NOTE — ED Notes (Signed)
Tele-psych at bedside.

## 2020-11-07 NOTE — ED Notes (Signed)
Report given to Bobby, RN.  

## 2020-11-07 NOTE — ED Provider Notes (Signed)
Signout from Dr. Read Drivers.  60 year old female who presented initially with abdominal pain, ultimately also getting TTS evaluation.  Plan is to follow-up on TTS eval. Physical Exam  BP (!) 137/96   Pulse 69   Temp 97.8 F (36.6 C) (Oral)   Resp 18   SpO2 99%   Physical Exam  ED Course/Procedures   Clinical Course as of 11/07/20 0721  Thu Nov 06, 2020  1959 Alert and oriented to person place and time [JK]  2013 Previous records reviewed.  Patient was at another medical facility for non-intractable vomiting with nausea last month [JK]  2203 Patient's ammonia level is normal.  White blood cell count is normal.  Patient having persistent abdominal tenderness.  Will CT [JK]  2218 Husband states patient still acting confused.  Calcium level is elevated but her total protein level is elevated.  Doubt that this would be the cause of her confusion. [JK]    Clinical Course User Index [JK] Linwood Dibbles, MD    Procedures  MDM  Received a call call from TTS.  They have psychiatrically cleared patient.  I found patient sitting in her room in no distress.  She denies any complaints.  She is comfortable plan for discharge.  Return instructions discussed      Terrilee Files, MD 11/07/20 [phone]

## 2020-11-07 NOTE — ED Notes (Signed)
Pt resting comfortably.  Informed another team would be asking her questions/performing an assessment.

## 2020-11-07 NOTE — ED Notes (Signed)
Provider notified of difficult IV stick. Another nurse attempting at this time

## 2022-03-23 IMAGING — CT CT ABD-PELV W/ CM
2 of 5 series · 16 of 46 positions shown, 18 images · IV contrast (OMNIPAQUE 300)
Comparison: 06/02/2020

CLINICAL DATA: Abdominal pain.

EXAM:
CT ABDOMEN AND PELVIS WITH CONTRAST
TECHNIQUE: Multidetector CT imaging of the abdomen and pelvis was performed
using the standard protocol following bolus administration of
intravenous contrast.
CONTRAST:  100mL OMNIPAQUE IOHEXOL 300 MG/ML  SOLN

[Series 2: axial st · axial · 0.86mm/px · z∈[-740,-335]mm · 13 of 95 slices shown, 15 images]
[im 7/95  soft-tissue]
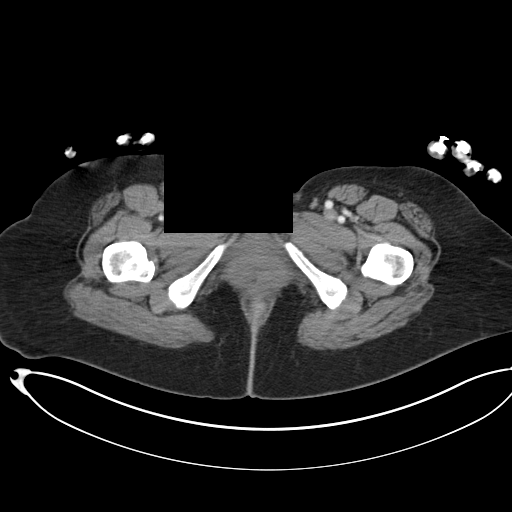
[im 7/95  bone]
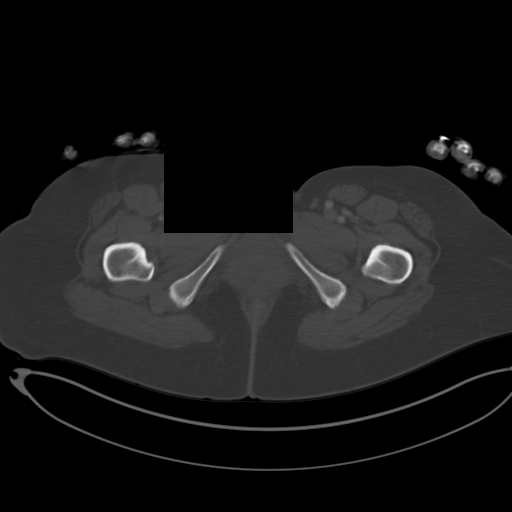
[im 14/95  soft-tissue]
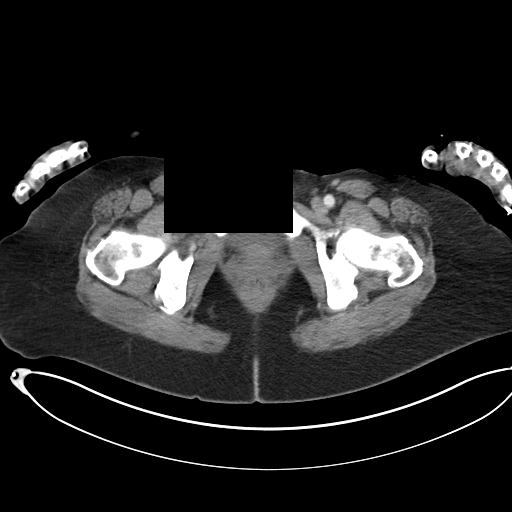
[im 21/95  soft-tissue]
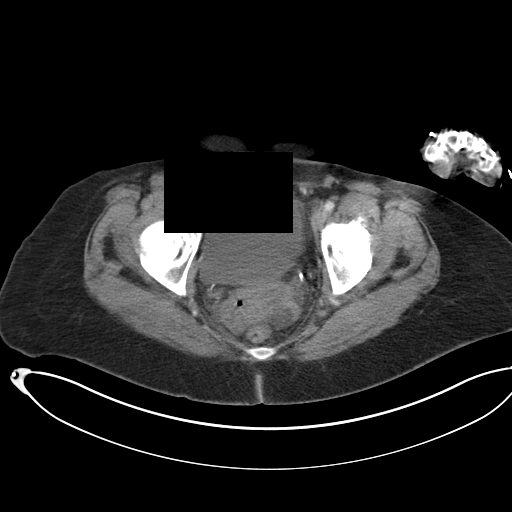
[im 27/95  soft-tissue]
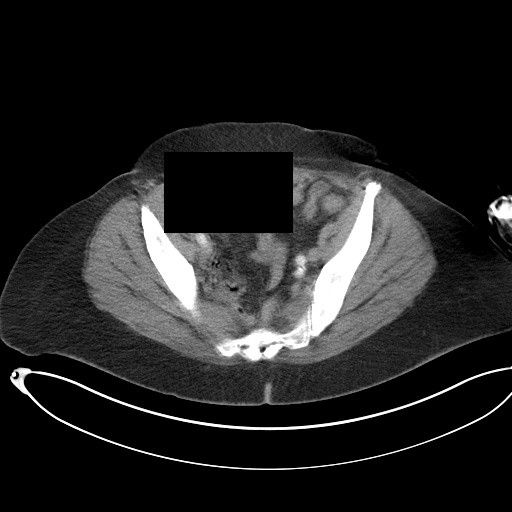
[im 34/95  soft-tissue]
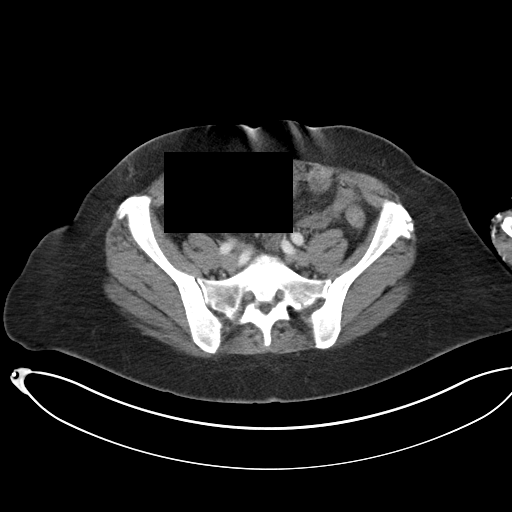
[im 41/95  soft-tissue]
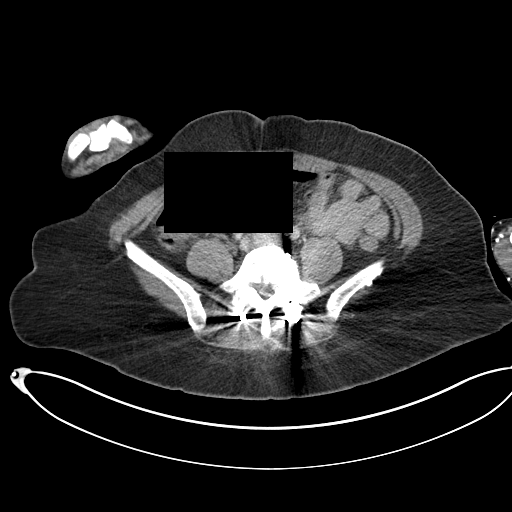
[im 48/95  soft-tissue]
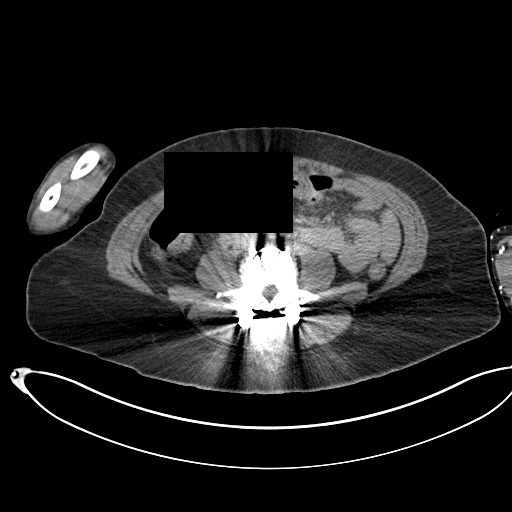
[im 54/95  soft-tissue]
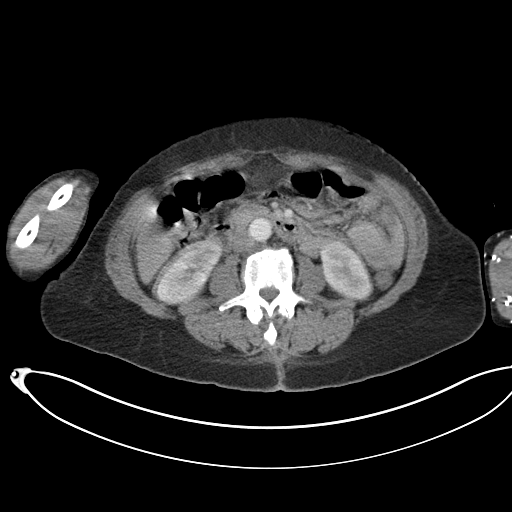
[im 61/95  soft-tissue]
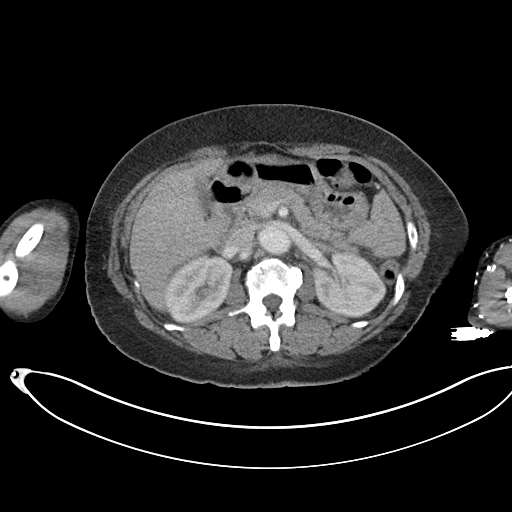
[im 61/95  bone]
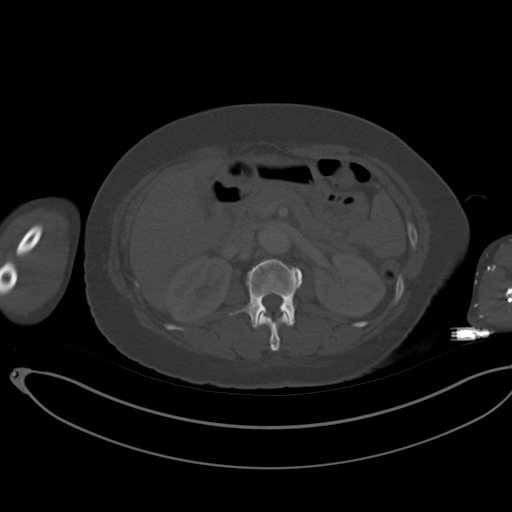
[im 68/95  soft-tissue]
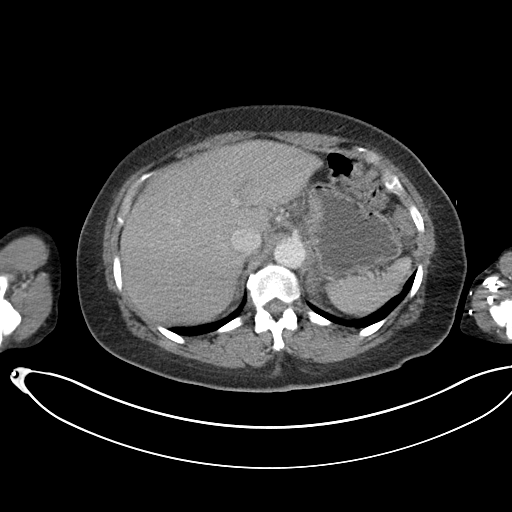
[im 74/95  soft-tissue]
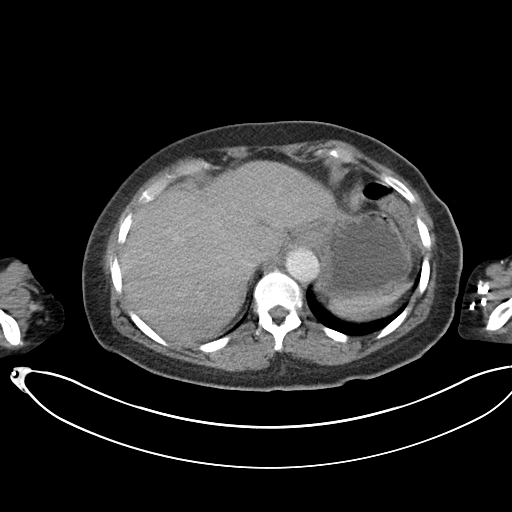
[im 81/95  soft-tissue]
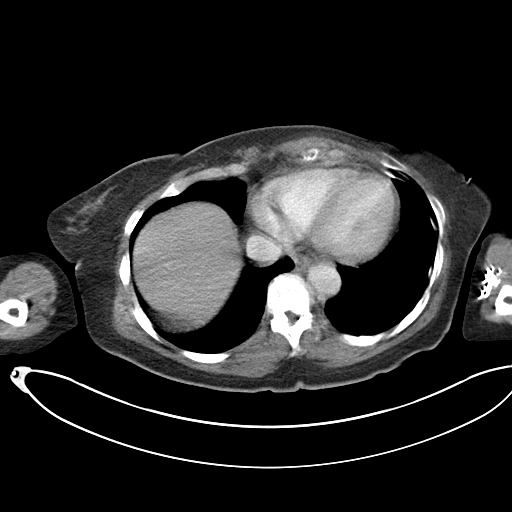
[im 88/95  soft-tissue]
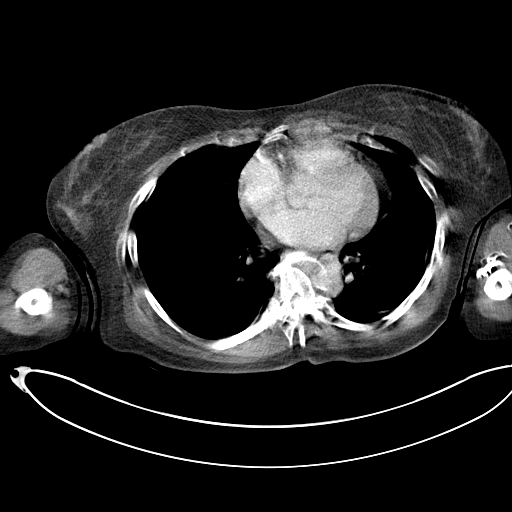

[Series 4: coronal st · coronal · 0.91mm/px · 3 of 133 slices shown]
[im 45/133  soft-tissue]
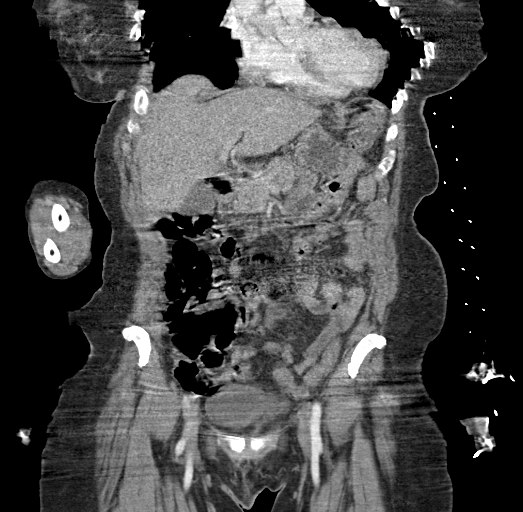
[im 59/133  soft-tissue]
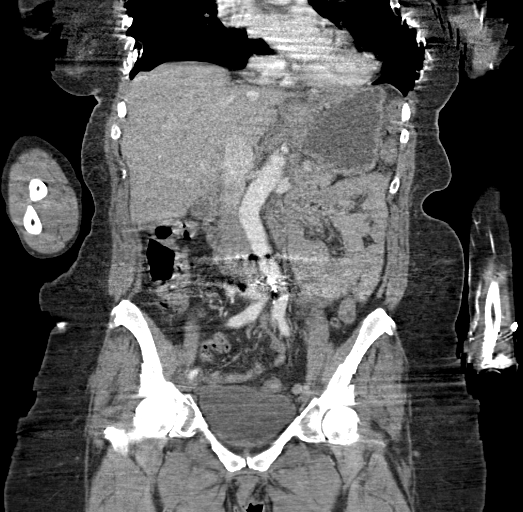
[im 74/133  soft-tissue]
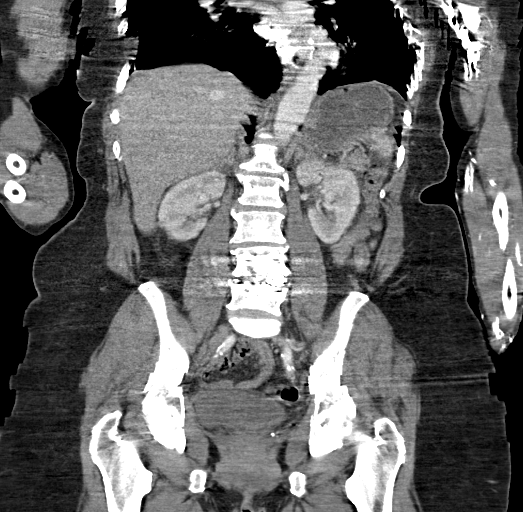

[16 of 46 positions shown; findings below may reference images not displayed]

FINDINGS: Lower chest: The lung bases are clear. The heart size is normal.

Hepatobiliary: The liver is normal. Normal gallbladder.There is no
biliary ductal dilation.

Pancreas: Normal contours without ductal dilatation. No
peripancreatic fluid collection.

Spleen: Unremarkable.

Adrenals/Urinary Tract:

--Adrenal glands: Unremarkable.

--Right kidney/ureter: No hydronephrosis or radiopaque kidney
stones.

--Left kidney/ureter: No hydronephrosis or radiopaque kidney stones.

--Urinary bladder: Unremarkable.

Stomach/Bowel:

--Stomach/Duodenum: No hiatal hernia or other gastric abnormality.
Normal duodenal course and caliber.

--Small bowel: Unremarkable.

--Colon: Unremarkable.

--Appendix: Not visualized. No right lower quadrant inflammation or
free fluid.

Vascular/Lymphatic: Atherosclerotic calcification is present within
the non-aneurysmal abdominal aorta, without hemodynamically
significant stenosis.

--No retroperitoneal lymphadenopathy.

--No mesenteric lymphadenopathy.

--No pelvic or inguinal lymphadenopathy.

Reproductive: Status post hysterectomy. No adnexal mass.

Other: No ascites or free air. The abdominal wall is normal.

Musculoskeletal. No acute displaced fractures.
IMPRESSION: 1. Examination is limited by significant motion artifact.
2. No acute abnormality.
3.  Aortic Atherosclerosis ([id number]-UXS.S).

## 2022-03-23 IMAGING — CT CT HEAD W/O CM
1 of 2 series · 16 of 30 positions shown, 20 images · non-contrast
Comparison: CT 07/06/2016

CLINICAL DATA: Altered mental status

EXAM:
CT HEAD WITHOUT CONTRAST
TECHNIQUE: Contiguous axial images were obtained from the base of the skull
through the vertex without intravenous contrast.

[Series 3: head wo · axial · 0.47mm/px · z∈[-46,+104]mm · 16 of 34 slices shown, 20 images]
[im 2/34  brain]
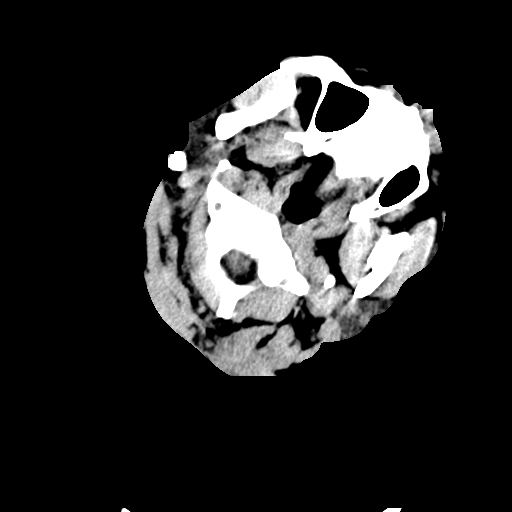
[im 2/34  bone]
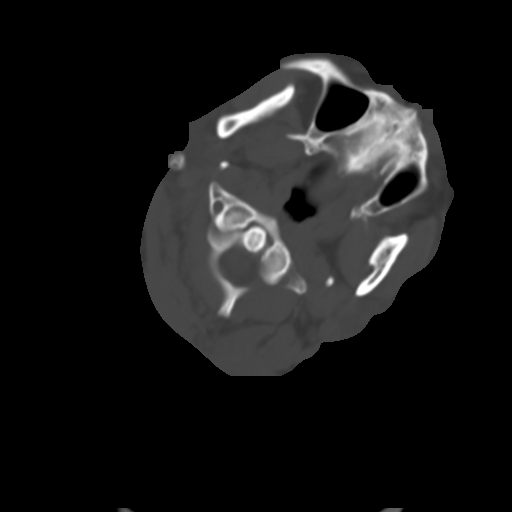
[im 4/34  brain]
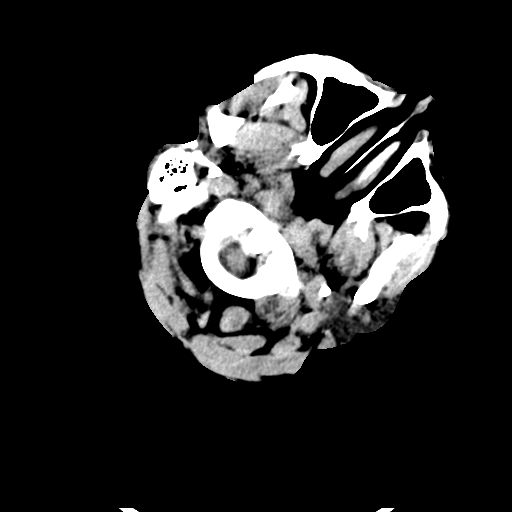
[im 6/34  brain]
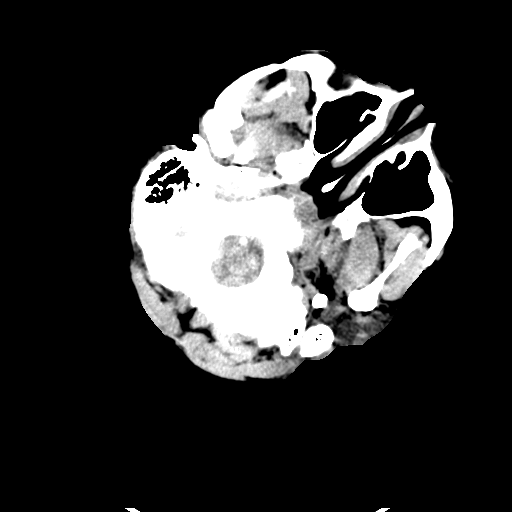
[im 8/34  brain]
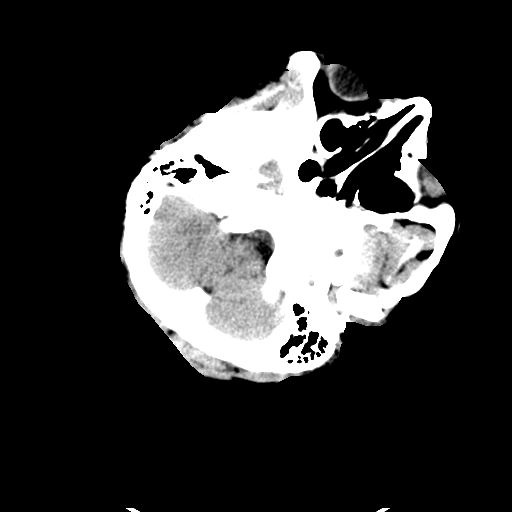
[im 10/34  brain]
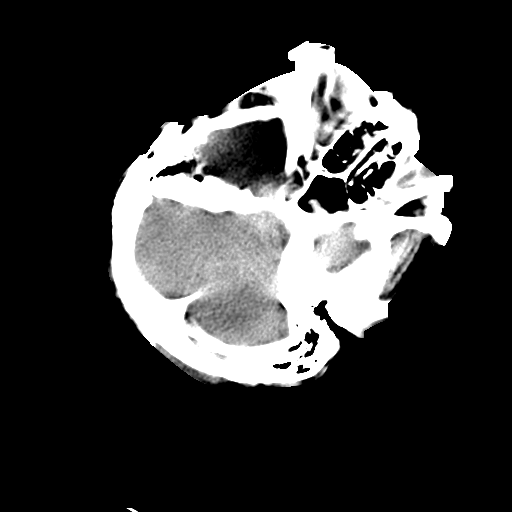
[im 10/34  bone]
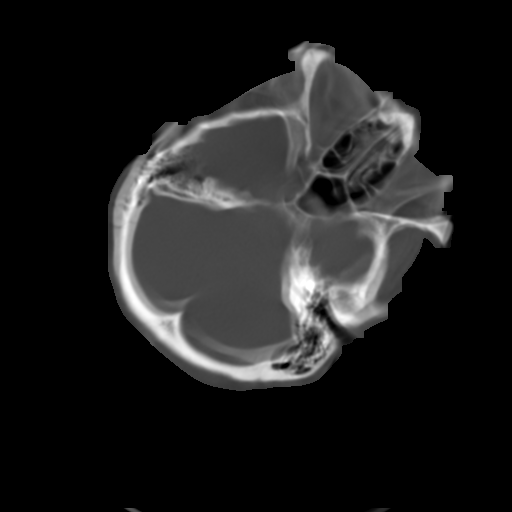
[im 12/34  brain]
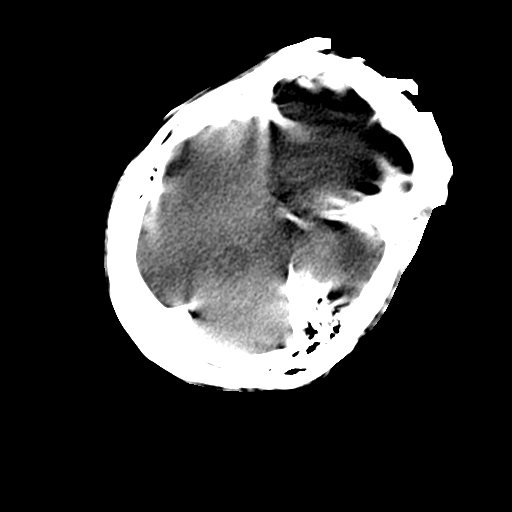
[im 13/34  brain]
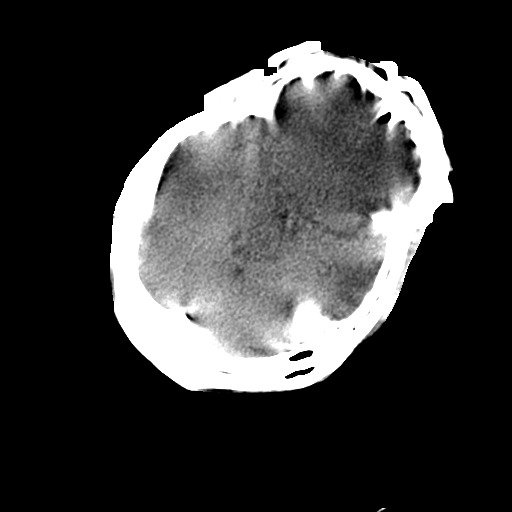
[im 15/34  brain]
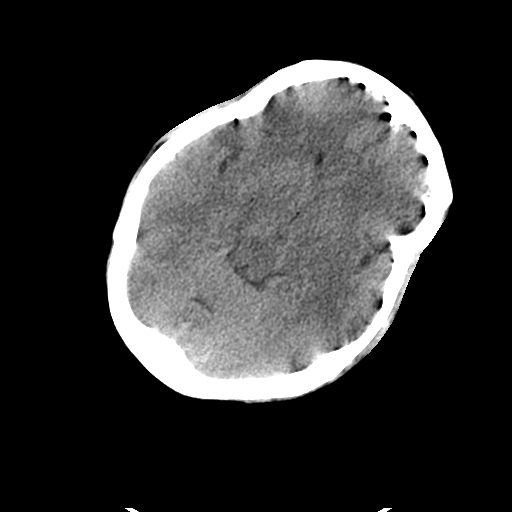
[im 19/34  brain]
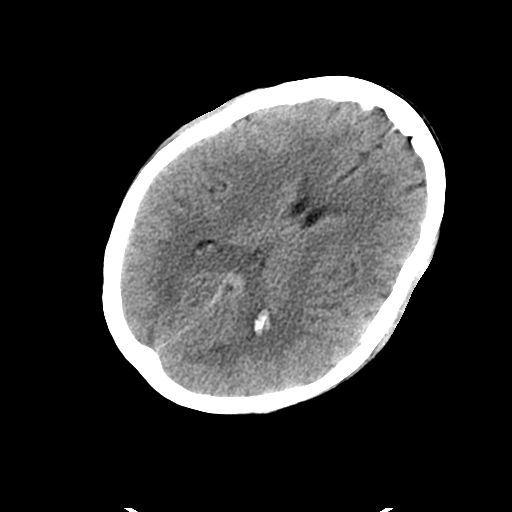
[im 19/34  bone]
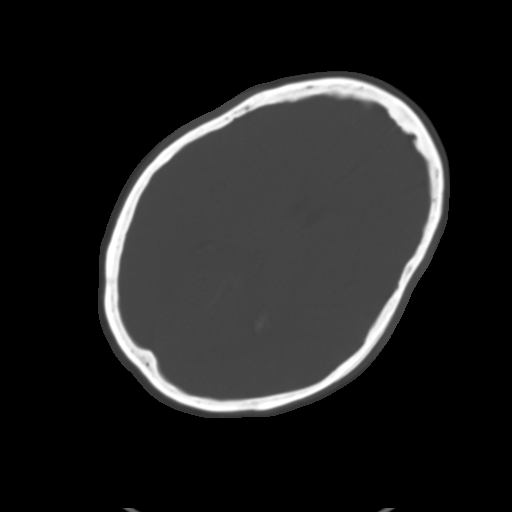
[im 21/34  brain]
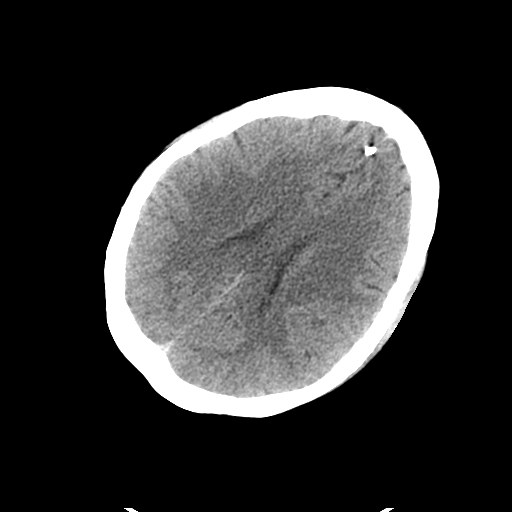
[im 23/34  brain]
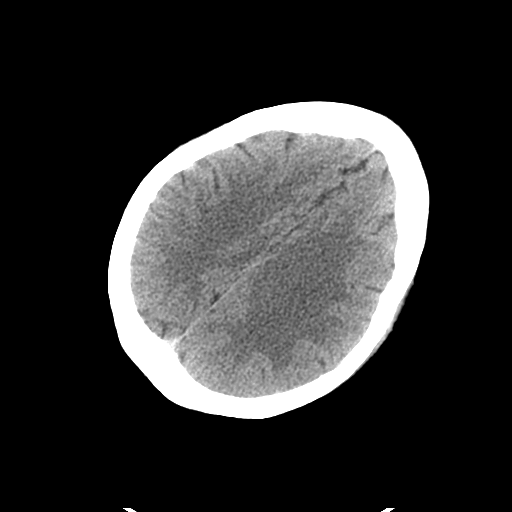
[im 24/34  brain]
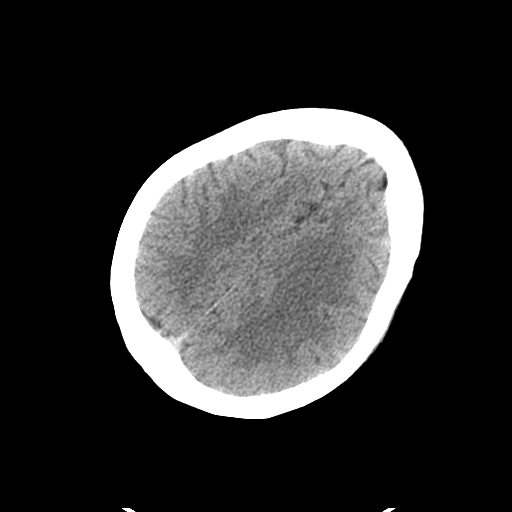
[im 26/34  brain]
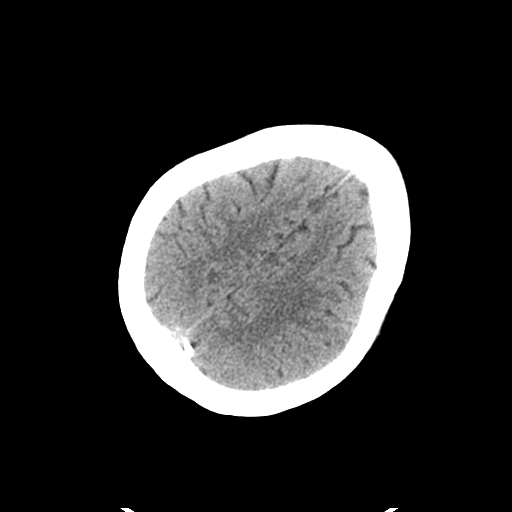
[im 26/34  bone]
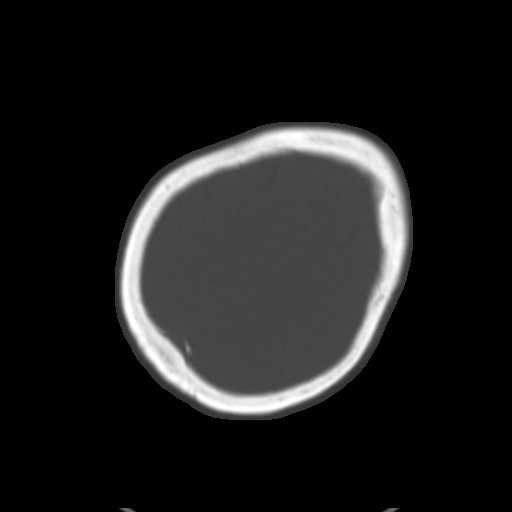
[im 28/34  brain]
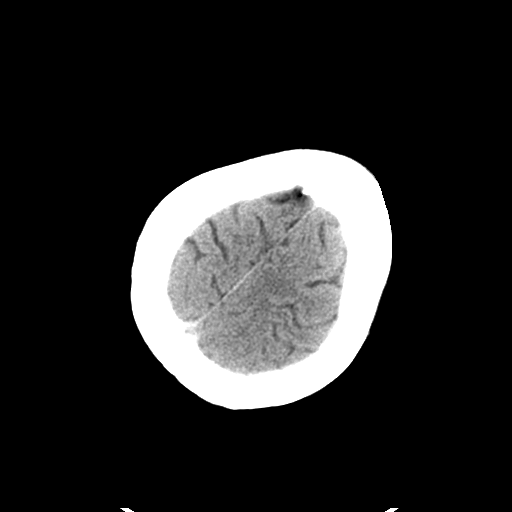
[im 30/34  brain]
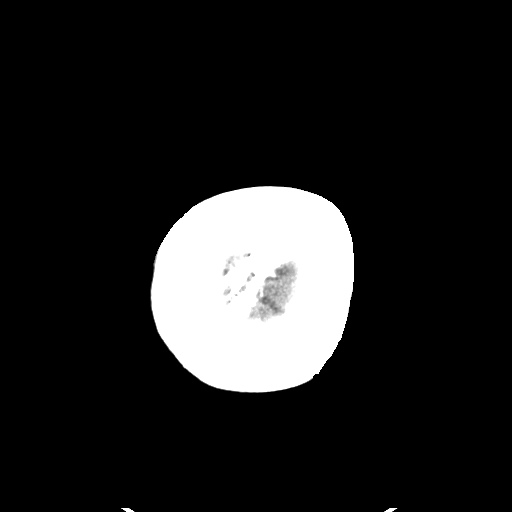
[im 32/34  brain]
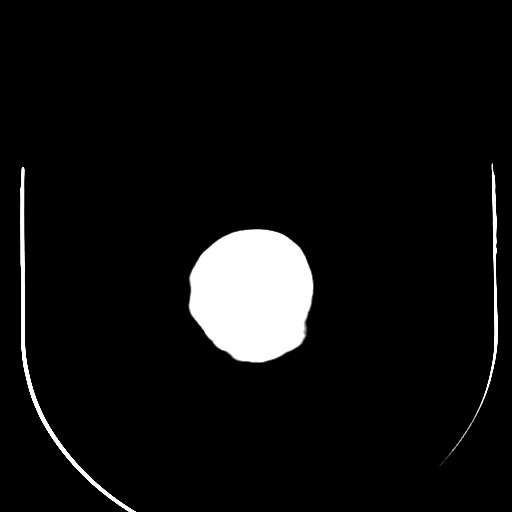

[16 of 30 positions shown; findings below may reference images not displayed]

FINDINGS: Brain: Motion degradation of the exam requiring multiple attempts at
acquisition. No evidence of acute infarction, hemorrhage,
hydrocephalus, extra-axial collection, visible mass lesion or mass
effect.

Vascular: No hyperdense vessel or unexpected calcification.

Skull: No calvarial fracture or suspicious osseous lesion. No scalp
swelling or hematoma.

Sinuses/Orbits: Paranasal sinuses and mastoid air cells are
predominantly clear. Included orbital structures are unremarkable.

Other: None.
IMPRESSION: 1. Motion degradation of the exam requiring multiple attempts at
acquisition.
2. No acute intracranial abnormality.

## 2023-04-11 ENCOUNTER — Ambulatory Visit: Payer: BC Managed Care – PPO | Admitting: Urology

## 2023-04-11 ENCOUNTER — Encounter: Payer: Self-pay | Admitting: Urology

## 2023-04-11 VITALS — BP 159/90 | HR 71 | Wt 193.0 lb

## 2023-04-11 DIAGNOSIS — R3129 Other microscopic hematuria: Secondary | ICD-10-CM

## 2023-04-11 HISTORY — DX: Other microscopic hematuria: R31.29

## 2023-04-11 LAB — MICROSCOPIC EXAMINATION
Cast Type: NONE SEEN
Casts: NONE SEEN /lpf
Crystal Type: NONE SEEN
Crystals: NONE SEEN
Renal Epithel, UA: NONE SEEN /hpf
Trichomonas, UA: NONE SEEN
Yeast, UA: NONE SEEN

## 2023-04-11 LAB — URINALYSIS, ROUTINE W REFLEX MICROSCOPIC
Bilirubin, UA: NEGATIVE
Glucose, UA: NEGATIVE
Ketones, UA: NEGATIVE
Leukocytes,UA: NEGATIVE
Nitrite, UA: NEGATIVE
Specific Gravity, UA: 1.025 (ref 1.005–1.030)
Urobilinogen, Ur: 1 mg/dL (ref 0.2–1.0)
pH, UA: 7 (ref 5.0–7.5)

## 2023-04-11 NOTE — Progress Notes (Signed)
Assessment: 1. Microscopic hematuria     Plan: Today I had a discussion with the patient regarding the findings of microscopic hematuria including the implications and differential diagnoses associated with it.  I also discussed recommendations for further evaluation including the rationale for upper tract imaging and cystoscopy.  I discussed the nature of these procedures including potential risk and complications.  The patient expressed an understanding of these issues. Schedule for CT hematuria protocol followed by cystoscopy  Chief Complaint:  Chief Complaint  Patient presents with   Hematuria    History of Present Illness:  Mikayla Hart is a 62 y.o. female who is seen for evaluation of microscopic hematuria.  She was first told she had microscopic hematuria in 2017 during a hospitalization for weight loss and nausea and vomiting.  She has periodically checked her urine with a home dipstick test which has shown evidence of blood.  She has not had a recent urinalysis.  No gross hematuria.  No dysuria.  She reports occasional pain in the right flank area associated with voiding.  She also has lower urinary tract symptoms including frequency, urgency, nocturia, and occasional nighttime incontinence associated with urgency.  No recent UTIs.  No history of kidney stones. No recent imaging. She has a history of tobacco use smoking 2 packs/day for 20 years, quitting in 1996.   Past Medical History:  Past Medical History:  Diagnosis Date   Acid reflux    Hypertension     Past Surgical History:  Past Surgical History:  Procedure Laterality Date   ABDOMINAL HYSTERECTOMY     BACK SURGERY     ORTHOPEDIC SURGERY      Allergies:  Allergies  Allergen Reactions   Morphine Itching   Amlodipine Cough   Clonidine     Other reaction(s): Other (See Comments) Makes her sleepy and very "drugged up" unknown Unable to awake    Tramadol Nausea Only and Nausea And Vomiting   Sulfa  Antibiotics    Tetracyclines & Related     Family History:  Family History  Family history unknown: Yes    Social History:  Social History   Tobacco Use   Smoking status: Former   Smokeless tobacco: Never  Substance Use Topics   Alcohol use: Yes   Drug use: No    Review of symptoms:  Constitutional:  Negative for unexplained weight loss, night sweats, fever, chills ENT:  Negative for nose bleeds, sinus pain, painful swallowing CV:  Negative for chest pain, shortness of breath, exercise intolerance, palpitations, loss of consciousness Resp:  Negative for cough, wheezing, shortness of breath GI:  Negative for nausea, vomiting, diarrhea, bloody stools GU:  Positives noted in HPI; otherwise negative for gross hematuria, dysuria Neuro:  Negative for seizures, poor balance, limb weakness, slurred speech Psych:  Negative for lack of energy, depression, anxiety Endocrine:  Negative for polydipsia, polyuria, symptoms of hypoglycemia (dizziness, hunger, sweating) Hematologic:  Negative for anemia, purpura, petechia, prolonged or excessive bleeding, use of anticoagulants  Allergic:  Negative for difficulty breathing or choking as a result of exposure to anything; no shellfish allergy; no allergic response (rash/itch) to materials, foods  Physical exam: BP (!) 159/90   Pulse 71   Wt 193 lb (87.5 kg)   BMI 35.30 kg/m  GENERAL APPEARANCE:  Well appearing, well developed, well nourished, NAD HEENT: Atraumatic, Normocephalic, oropharynx clear. NECK: Supple without lymphadenopathy or thyromegaly. LUNGS: Clear to auscultation bilaterally. HEART: Regular Rate and Rhythm without murmurs, gallops, or rubs. ABDOMEN:  Soft, non-tender, No Masses. EXTREMITIES: Moves all extremities well.  Without clubbing, cyanosis, or edema. NEUROLOGIC:  Alert and oriented x 3, normal gait, CN II-XII grossly intact.  MENTAL STATUS:  Appropriate. BACK:  Non-tender to palpation.  No CVAT SKIN:  Warm, dry and  intact.    Results: Results for orders placed or performed in visit on 04/11/23 (from the past 24 hour(s))  Urinalysis, Routine w reflex microscopic     Status: Abnormal   Collection Time: 04/11/23  2:36 PM  Result Value Ref Range   Specific Gravity, UA 1.025 1.005 - 1.030   pH, UA 7.0 5.0 - 7.5   Color, UA Yellow Yellow   Appearance Ur Clear Clear   Leukocytes,UA Negative Negative   Protein,UA Trace (A) Negative/Trace   Glucose, UA Negative Negative   Ketones, UA Negative Negative   RBC, UA 2+ (A) Negative   Bilirubin, UA Negative Negative   Urobilinogen, Ur 1.0 0.2 - 1.0 mg/dL   Nitrite, UA Negative Negative   Microscopic Examination See below:    Narrative   Performed at:  01 - [email or web address]  [facility] [address], [place], [place]  [id number] Lab Director: Corinna Lines MT, Phone:  [phone]  Microscopic Examination     Status: Abnormal   Collection Time: 04/11/23  2:36 PM   Urine  Result Value Ref Range   WBC, UA 0-5 0 - 5 /hpf   RBC, Urine 11-30 (A) 0 - 2 /hpf   Epithelial Cells (non renal) 0-10 0 - 10 /hpf   Renal Epithel, UA None seen None seen /hpf   Casts None seen None seen /lpf   Cast Type None seen N/A   Crystals None seen N/A   Crystal Type None seen N/A   Mucus, UA Present (A) Not Estab.   Bacteria, UA Moderate (A) None seen/Few   Yeast, UA None seen None seen   Trichomonas, UA None seen None seen   Narrative   Performed at:  [address] [facility]  [facility] [address], [place], [place]  [id number] Lab Director: Corinna Lines MT, Phone:  [phone]

## 2023-04-13 ENCOUNTER — Telehealth (HOSPITAL_BASED_OUTPATIENT_CLINIC_OR_DEPARTMENT_OTHER): Payer: Self-pay

## 2023-04-14 ENCOUNTER — Telehealth (HOSPITAL_BASED_OUTPATIENT_CLINIC_OR_DEPARTMENT_OTHER): Payer: Self-pay

## 2023-04-27 ENCOUNTER — Ambulatory Visit (HOSPITAL_BASED_OUTPATIENT_CLINIC_OR_DEPARTMENT_OTHER)
Admission: RE | Admit: 2023-04-27 | Discharge: 2023-04-27 | Disposition: A | Discharge status: Home / Self Care | Payer: BC Managed Care – PPO | Source: Ambulatory Visit | Attending: Urology | Admitting: Urology

## 2023-04-27 DIAGNOSIS — R3129 Other microscopic hematuria: Secondary | ICD-10-CM | POA: Diagnosis present

## 2023-04-27 MED ORDER — IOHEXOL 300 MG/ML  SOLN
125.0000 mL | Freq: Once | INTRAMUSCULAR | Status: AC | PRN
  Administered 2023-04-27: 125 mL via INTRAVENOUS

## 2023-04-28 ENCOUNTER — Telehealth: Payer: Self-pay | Admitting: Urology

## 2023-04-28 NOTE — Telephone Encounter (Signed)
Patient would like to know the results of the CT and would like the information sent to PCP on file. Mikayla Hart

## 2023-05-04 ENCOUNTER — Encounter: Payer: Self-pay | Admitting: Urology

## 2023-05-11 ENCOUNTER — Other Ambulatory Visit: Payer: BC Managed Care – PPO | Admitting: Urology

## 2023-05-19 ENCOUNTER — Ambulatory Visit: Payer: BC Managed Care – PPO | Admitting: Urology

## 2023-05-19 VITALS — BP 167/102 | HR 78 | Ht 62.5 in | Wt 191.0 lb

## 2023-05-19 DIAGNOSIS — R3129 Other microscopic hematuria: Secondary | ICD-10-CM | POA: Diagnosis not present

## 2023-05-19 LAB — URINALYSIS, ROUTINE W REFLEX MICROSCOPIC
Bilirubin, UA: NEGATIVE
Glucose, UA: NEGATIVE
Ketones, UA: NEGATIVE
Leukocytes,UA: NEGATIVE
Nitrite, UA: NEGATIVE
Protein,UA: NEGATIVE
Specific Gravity, UA: 1.02 (ref 1.005–1.030)
Urobilinogen, Ur: 0.2 mg/dL (ref 0.2–1.0)
pH, UA: 7 (ref 5.0–7.5)

## 2023-05-19 LAB — MICROSCOPIC EXAMINATION

## 2023-05-19 NOTE — Progress Notes (Signed)
Assessment: 1. Microscopic hematuria; negative evaluation 8/24     Plan: I personally reviewed the CT study from 05/04/2023 with results as noted below.  Discussed with patient. Patient declined antibiotic following cystoscopy Return to office in 6 months.  Chief Complaint:  Chief Complaint  Patient presents with   Cysto    History of Present Illness:  Mikayla Hart is a 62 y.o. female who is seen for further evaluation of microscopic hematuria.  She was first told she had microscopic hematuria in 2017 during a hospitalization for weight loss and nausea and vomiting.  She has periodically checked her urine with a home dipstick test which has shown evidence of blood.  She has not had a recent urinalysis.  No gross hematuria.  No dysuria.  She reports occasional pain in the right flank area associated with voiding.  She also has lower urinary tract symptoms including frequency, urgency, nocturia, and occasional nighttime incontinence associated with urgency.  No recent UTIs.  No history of kidney stones.  She has a history of tobacco use smoking 2 packs/day for 20 years, quitting in 1996. 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  She presents today for further evaluation with cystoscopy.  No gross hematuria or dysuria. She reported an episode of right flank pain with nausea and vomiting prior to her CT.  Portions of the above documentation were copied from a prior visit for review purposes only.   Past Medical History:  Past Medical History:  Diagnosis Date   Acid reflux    Hypertension     Past Surgical History:  Past Surgical History:  Procedure Laterality Date   ABDOMINAL HYSTERECTOMY     BACK SURGERY     ORTHOPEDIC SURGERY      Allergies:  Allergies  Allergen Reactions   Morphine Itching   Amlodipine Cough   Clonidine     Other  reaction(s): Other (See Comments) Makes her sleepy and very "drugged up" unknown Unable to awake    Tramadol Nausea Only and Nausea And Vomiting   Sulfa Antibiotics    Tetracyclines & Related     Family History:  Family History  Family history unknown: Yes    Social History:  Social History   Tobacco Use   Smoking status: Former   Smokeless tobacco: Never  Substance Use Topics   Alcohol use: Yes   Drug use: No    ROS: Constitutional:  Negative for fever, chills, weight loss CV: Negative for chest pain, previous MI, hypertension Respiratory:  Negative for shortness of breath, wheezing, sleep apnea, frequent cough GI:  Negative for nausea, vomiting, bloody stool, GERD  Physical exam: BP (!) 167/102   Pulse 78   Ht 5' 2.5" (1.588 m)   Wt 191 lb (86.6 kg)   BMI 34.38 kg/m  GENERAL APPEARANCE:  Well appearing, well developed, well nourished, NAD HEENT:  Atraumatic, normocephalic, oropharynx clear NECK:  Supple without lymphadenopathy or thyromegaly ABDOMEN:  Soft, non-tender, no masses EXTREMITIES:  Moves all extremities well, without clubbing, cyanosis, or edema NEUROLOGIC:  Alert and oriented x 3, normal gait, CN II-XII grossly intact MENTAL STATUS:  appropriate BACK:  Non-tender to palpation, No CVAT SKIN:  Warm, dry, and intact  Results: U/A: 3-10 RBC  CYSTOSCOPY  Procedure: Flexible cystoscopy  Pre-Operative Diagnosis:  Microscopic hematuria  Post-Operative Diagnosis: Microscopic hematuria  Anesthesia: local with lidocaine gel  Surgical Narrative:  After appropriate informed consent was obtained, the patient was prepped and draped in the usual sterile fashion in the supine position. She was correctly identified and the proper procedure delineated prior to proceeding. Sterile lidocaine gel was instilled in the urethra.  The flexible cystoscope was introduced without difficulty.  Findings:  Urethra: Normal  Bladder: Normal  Ureteral orifices:  normal  Additional findings: none  A bladder wash was not obtained for cytology.  Pelvic exam was not positive for pelvic prolapse.  She tolerated the procedure well.  A chaperone was present throughout the procedure.

## 2023-10-03 ENCOUNTER — Ambulatory Visit (HOSPITAL_BASED_OUTPATIENT_CLINIC_OR_DEPARTMENT_OTHER): Payer: BC Managed Care – PPO | Admitting: Student

## 2023-10-03 ENCOUNTER — Encounter (HOSPITAL_BASED_OUTPATIENT_CLINIC_OR_DEPARTMENT_OTHER): Payer: Self-pay | Admitting: Student

## 2023-10-03 VITALS — BP 123/80 | HR 57 | Temp 98.3°F | Ht 62.32 in | Wt 174.3 lb

## 2023-10-03 DIAGNOSIS — Z1211 Encounter for screening for malignant neoplasm of colon: Secondary | ICD-10-CM | POA: Diagnosis not present

## 2023-10-03 DIAGNOSIS — Z7689 Persons encountering health services in other specified circumstances: Secondary | ICD-10-CM

## 2023-10-03 DIAGNOSIS — K219 Gastro-esophageal reflux disease without esophagitis: Secondary | ICD-10-CM | POA: Diagnosis not present

## 2023-10-03 DIAGNOSIS — R112 Nausea with vomiting, unspecified: Secondary | ICD-10-CM

## 2023-10-03 DIAGNOSIS — Z1231 Encounter for screening mammogram for malignant neoplasm of breast: Secondary | ICD-10-CM

## 2023-10-03 DIAGNOSIS — I10 Essential (primary) hypertension: Secondary | ICD-10-CM

## 2023-10-03 DIAGNOSIS — G8929 Other chronic pain: Secondary | ICD-10-CM

## 2023-10-03 DIAGNOSIS — E010 Iodine-deficiency related diffuse (endemic) goiter: Secondary | ICD-10-CM | POA: Insufficient documentation

## 2023-10-03 DIAGNOSIS — E049 Nontoxic goiter, unspecified: Secondary | ICD-10-CM

## 2023-10-03 DIAGNOSIS — Z131 Encounter for screening for diabetes mellitus: Secondary | ICD-10-CM

## 2023-10-03 DIAGNOSIS — R1010 Upper abdominal pain, unspecified: Secondary | ICD-10-CM

## 2023-10-03 HISTORY — DX: Encounter for screening for malignant neoplasm of colon: Z12.11

## 2023-10-03 HISTORY — DX: Other chronic pain: G89.29

## 2023-10-03 HISTORY — DX: Nausea with vomiting, unspecified: R11.2

## 2023-10-03 HISTORY — DX: Nontoxic goiter, unspecified: E04.9

## 2023-10-03 NOTE — Patient Instructions (Signed)
It was nice to see you today!  As we discussed in clinic I will order some basic labs today and follow-up with you on them.  I will be sending in for a colonoscopy and a mammogram.  If you have any problems before your next visit feel free to message me via MyChart (minor issues or questions) or call the office, otherwise you may reach out to schedule an office visit.  Thank you! Gerilyn Pilgrim Aland Chestnutt, PA-C

## 2023-10-03 NOTE — Assessment & Plan Note (Signed)
Will assess adrenal glands with CT records due to palpitations, increased bp, and sweating associated with episodes.  May get plasma metanephrines at next visit.  Reduce marijuana use.  Patient states she had a cardio workup in the past and denies EKG at this visit.

## 2023-10-03 NOTE — Assessment & Plan Note (Signed)
Stable; continue current regimen.

## 2023-10-03 NOTE — Progress Notes (Signed)
New Patient Office Visit  Subjective    Patient ID: Mikayla Hart, female    DOB: 1961-02-01  Age: 62 y.o. MRN: [id number]  CC:  Chief Complaint  Patient presents with   Establish Care    Here to establish care. Was at [facility] ER last week due to vomiting. Lipase levels were high and maybe amylase were too. Last PCP is with [facility]. They not interested in helping. Belly is still sore around epigastric region. Tender to touch. Appetite and eating is better.     HPI Mikayla Hart presents to establish care. 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 Last physical was in 2024. she notes that she requires refills of none.   History of Sjogren's, hypertension (stable on losartan 100 mg and chlorthalidone 50 mg-continue), GERD (on Protonix, but patient states that she does not plan to take this long-term), and OA.  Patient states that she is not taking her Celebrex.  Screenings:  Colon Cancer: Has not had colonoscopy- 12 years ago- states that polyps were found.  Lung Cancer: Quit in 1997, currently smokes marijuana counseled on Marijuana use.  Breast Cancer: Did not do one last year. Due for mammogram.  Diabetes: History of T2DM in family. A1c indicated.  HLD: No recent lipid panel, indicated.   Acute Problems:  Chronic abdominal pain- Patient notes that she has been having chronic abdominal pain with n/v for years. Notes that she started smoking weed in 2017 due to nausea. Hospitalized in 2021 due to N/V. Has daughter that was dx with "abdominal migraines." Notes that lipase was elevated in the ER last week. States that ER provider was concerned for possible early pancreatitis but was told to follow with primary care. States that K was low in ER- treatment with 80 meq of K. States that calcium runs around 10.2. No history of hypertriglyceridemia. Has not seen GI in a "while". History of GI workup negative years ago, still having same symptoms- will review records requested. Worse after food,  with laying on stomach. Patient notes significant weight loss due to lack of appetite. Denies coffee ground emesis, no stool changes aside from recent constipation. States before she gets nauseated she will get diffusely diaphoretic.  Patient notes possible hot flashes in addition to nausea. History of irritable bowel. Had history of constipation. Feels like heart starts racing during that time. Patient notes that she gets palpitations as well. Notes episodes of increased BP during or after episodes- highest sys around 190. Patient denies cardio workup, states that she was worked up in the past.  I have spent greater than 60 minutes on diagnosis, charting, and treatment for this visit.  Outpatient Encounter Medications as of 10/03/2023  Medication Sig   acetaminophen (TYLENOL) 500 MG tablet Take 500 mg by mouth every 6 (six) hours as needed for mild pain or headache.   celecoxib (CELEBREX) 200 MG capsule Take 200 mg by mouth 2 (two) times daily.   chlorthalidone (HYGROTON) 25 MG tablet Take 1 tablet (25 mg total) by mouth daily. (Patient taking differently: Take 50 mg by mouth daily.)   Cholecalciferol 50 MCG (2000 UT) TABS Take 2,000 Units by mouth daily.    gabapentin (NEURONTIN) 100 MG capsule Take 100-300 mg by mouth as needed. 100mg  in the morning and 300mg  at bedtime   losartan (COZAAR) 100 MG tablet Take 100 mg by mouth daily.    ondansetron (ZOFRAN ODT) 8 MG disintegrating tablet Take 1 tablet (8 mg total) by mouth every  8 (eight) hours as needed for nausea or vomiting.   pantoprazole (PROTONIX) 40 MG tablet Take 40 mg by mouth daily.   Potassium Chloride ER 20 MEQ TBCR Take 40 mEq by mouth daily.    promethazine (PHENERGAN) 12.5 MG suppository Place 12.5 mg rectally as needed.   [DISCONTINUED] sucralfate (CARAFATE) 1 g tablet Take 1 tablet (1 g total) by mouth 4 (four) times daily -  with meals and at bedtime.   [DISCONTINUED] escitalopram (LEXAPRO) 20 MG tablet Take 20 mg by mouth daily.  (Patient not taking: Reported on 10/03/2023)   [DISCONTINUED] fluticasone (FLONASE) 50 MCG/ACT nasal spray Place 1 spray into both nostrils daily as needed for allergies. (Patient not taking: Reported on 10/03/2023)   [DISCONTINUED] metoprolol succinate (TOPROL-XL) 25 MG 24 hr tablet Take 12.5-25 mg by mouth daily. Pt has palpitations if 12.5mg  doesn't work take another 12.5mg  (Patient not taking: Reported on 10/03/2023)   [DISCONTINUED] polyvinyl alcohol (LIQUIFILM TEARS) 1.4 % ophthalmic solution Place 1 drop into both eyes as needed for dry eyes. (Patient not taking: Reported on 10/03/2023)   No facility-administered encounter medications on file as of 10/03/2023.    Past Medical History:  Diagnosis Date   Acid reflux    Hypertension    Osteoarthritis    Sjogren's disease (HCC)     Past Surgical History:  Procedure Laterality Date   ABDOMINAL HYSTERECTOMY     BACK SURGERY     2   BUNIONECTOMY     CESAREAN SECTION     3   HAMMER TOE SURGERY     ORTHOPEDIC SURGERY     SALPINGECTOMY     2010    Family History  Problem Relation Age of Onset   Hypertension Mother    Heart disease Mother    Stroke Mother        died of   Diabetes type II Mother    Glaucoma Father    Hypercholesterolemia Father     Social History   Socioeconomic History   Marital status: Married    Spouse name: Not on file   Number of children: 5   Years of education: Not on file   Highest education level: Not on file  Occupational History   Occupation: Clinical cytogeneticist of [employer]    Comment: LPN  Tobacco Use   Smoking status: Some Days    Types: Cigarettes    Passive exposure: Never   Smokeless tobacco: Never   Tobacco comments:    Smokes tobacco w/ marijuana   Substance and Sexual Activity   Alcohol use: Yes    Comment: rarely   Drug use: Yes    Types: Marijuana   Sexual activity: Not on file  Other Topics Concern   Not on file  Social History Narrative   Not on file    Social Drivers of Health   Financial Resource Strain: Not on file  Food Insecurity: Not on file  Transportation Needs: Not on file  Physical Activity: Not on file  Stress: Not on file  Social Connections: Unknown (02/12/2022)   Received from [facility]   Social Network    Social Network: Not on file  Intimate Partner Violence: Unknown (01/04/2022)   Received from [facility]   HITS    Physically Hurt: Not on file    Insult or Talk Down To: Not on file    Threaten Physical Harm: Not on file    Scream or Curse: Not on  file    ROS  Per HPI      Objective    BP 123/80 (BP Location: Left Arm, Patient Position: Sitting, Cuff Size: Normal)   Pulse (!) 57   Temp 98.3 F (36.8 C) (Oral)   Ht 5' 2.32" (1.583 m)   Wt 174 lb 4.8 oz (79.1 kg)   SpO2 98%   BMI 31.55 kg/m   Physical Exam Constitutional:      General: She is not in acute distress.    Appearance: Normal appearance. She is not ill-appearing.  HENT:     Head: Normocephalic and atraumatic.     Right Ear: External ear normal.     Left Ear: External ear normal.     Nose: Nose normal.     Mouth/Throat:     Mouth: Mucous membranes are moist.     Pharynx: Oropharynx is clear.  Eyes:     General: No scleral icterus.    Extraocular Movements: Extraocular movements intact.     Conjunctiva/sclera: Conjunctivae normal.     Pupils: Pupils are equal, round, and reactive to light.  Neck:     Vascular: No carotid bruit.  Cardiovascular:     Rate and Rhythm: Normal rate and regular rhythm.     Pulses: Normal pulses.     Heart sounds: Normal heart sounds. No murmur heard.    No friction rub.  Pulmonary:     Effort: Pulmonary effort is normal. No respiratory distress.     Breath sounds: Normal breath sounds. No wheezing, rhonchi or rales.  Abdominal:     General: Bowel sounds are normal. There is no distension.     Palpations: Abdomen is soft. There is no mass.     Tenderness: There  is abdominal tenderness. There is guarding. There is no rebound.     Hernia: No hernia is present.     Comments: Pain to epigastric region- patient would not let provider deeply palpate epigastric region. Nontender to lower abdomen.   Negative for ecchymosis.  Musculoskeletal:        General: Normal range of motion.     Cervical back: Neck supple.     Right lower leg: No edema.     Left lower leg: No edema.  Lymphadenopathy:     Cervical: No cervical adenopathy.  Skin:    General: Skin is warm and dry.  Neurological:     Mental Status: She is alert.         Assessment & Plan:   Encounter to establish care  Screen for colon cancer -     Ambulatory referral to Gastroenterology  Chronic upper abdominal pain Assessment & Plan: Will assess adrenal glands with CT records due to palpitations, increased bp, and sweating associated with episodes.  May get plasma metanephrines at next visit.  Reduce marijuana use.  Patient states she had a cardio workup in the past and denies EKG at this visit.   Orders: -     Lipase -     CBC with Differential/Platelet -     Comprehensive metabolic panel -     Ambulatory referral to Gastroenterology  Screening mammogram for breast cancer -     3D Screening Mammogram, Left and Right; Future  Essential hypertension -     CBC with Differential/Platelet -     Comprehensive metabolic panel  Screening for diabetes mellitus -     Hemoglobin A1c  Nausea and vomiting, unspecified vomiting type Assessment & Plan: Reduce marijuana intake. Continue  abdominal workup.     Return in about 6 weeks (around 11/14/2023) for Chronic Followup.   Teryl Lucy Ramie Palladino, PA-C

## 2023-10-03 NOTE — Assessment & Plan Note (Signed)
Stable --continue Protonix

## 2023-10-03 NOTE — Assessment & Plan Note (Signed)
Reduce marijuana intake. Continue abdominal workup.

## 2023-10-04 ENCOUNTER — Ambulatory Visit (HOSPITAL_BASED_OUTPATIENT_CLINIC_OR_DEPARTMENT_OTHER): Payer: BC Managed Care – PPO | Admitting: Student

## 2023-10-04 LAB — CBC WITH DIFFERENTIAL/PLATELET
Basophils Absolute: 0 10*3/uL (ref 0.0–0.2)
Basos: 0 %
EOS (ABSOLUTE): 0 10*3/uL (ref 0.0–0.4)
Eos: 1 %
Hematocrit: 40.8 % (ref 34.0–46.6)
Hemoglobin: 12.3 g/dL (ref 11.1–15.9)
Immature Grans (Abs): 0 10*3/uL (ref 0.0–0.1)
Immature Granulocytes: 0 %
Lymphocytes Absolute: 4.2 10*3/uL — ABNORMAL HIGH (ref 0.7–3.1)
Lymphs: 56 %
MCH: 23.9 pg — ABNORMAL LOW (ref 26.6–33.0)
MCHC: 30.1 g/dL — ABNORMAL LOW (ref 31.5–35.7)
MCV: 79 fL (ref 79–97)
Monocytes Absolute: 0.8 10*3/uL (ref 0.1–0.9)
Monocytes: 10 %
Neutrophils Absolute: 2.5 10*3/uL (ref 1.4–7.0)
Neutrophils: 33 %
Platelets: 240 10*3/uL (ref 150–450)
RBC: 5.15 x10E6/uL (ref 3.77–5.28)
RDW: 14.5 % (ref 11.7–15.4)
WBC: 7.5 10*3/uL (ref 3.4–10.8)

## 2023-10-04 LAB — COMPREHENSIVE METABOLIC PANEL
ALT: 26 [IU]/L (ref 0–32)
AST: 25 [IU]/L (ref 0–40)
Albumin: 4.1 g/dL (ref 3.9–4.9)
Alkaline Phosphatase: 100 [IU]/L (ref 44–121)
BUN/Creatinine Ratio: 16 (ref 12–28)
BUN: 13 mg/dL (ref 8–27)
Bilirubin Total: 0.3 mg/dL (ref 0.0–1.2)
CO2: 28 mmol/L (ref 20–29)
Calcium: 10 mg/dL (ref 8.7–10.3)
Chloride: 100 mmol/L (ref 96–106)
Creatinine, Ser: 0.81 mg/dL (ref 0.57–1.00)
Globulin, Total: 2.1 g/dL (ref 1.5–4.5)
Glucose: 91 mg/dL (ref 70–99)
Potassium: 3.6 mmol/L (ref 3.5–5.2)
Sodium: 142 mmol/L (ref 134–144)
Total Protein: 6.2 g/dL (ref 6.0–8.5)
eGFR: 82 mL/min/{1.73_m2} (ref >59)

## 2023-10-04 LAB — HEMOGLOBIN A1C
Est. average glucose Bld gHb Est-mCnc: 128 mg/dL
Hgb A1c MFr Bld: 6.1 % — ABNORMAL HIGH (ref 4.8–5.6)

## 2023-10-04 LAB — LIPASE: Lipase: 30 U/L (ref 14–72)

## 2023-10-21 ENCOUNTER — Encounter (HOSPITAL_BASED_OUTPATIENT_CLINIC_OR_DEPARTMENT_OTHER): Payer: Self-pay | Admitting: Student

## 2023-11-04 ENCOUNTER — Ambulatory Visit (INDEPENDENT_AMBULATORY_CARE_PROVIDER_SITE_OTHER): Payer: Self-pay | Admitting: Nurse Practitioner

## 2023-11-04 ENCOUNTER — Ambulatory Visit: Payer: Self-pay | Admitting: Nurse Practitioner

## 2023-11-04 ENCOUNTER — Encounter: Payer: Self-pay | Admitting: Nurse Practitioner

## 2023-11-04 VITALS — BP 130/80 | HR 68 | Ht 62.0 in | Wt 170.0 lb

## 2023-11-04 DIAGNOSIS — Z860100 Personal history of colon polyps, unspecified: Secondary | ICD-10-CM

## 2023-11-04 DIAGNOSIS — R1013 Epigastric pain: Secondary | ICD-10-CM

## 2023-11-04 DIAGNOSIS — R002 Palpitations: Secondary | ICD-10-CM

## 2023-11-04 DIAGNOSIS — K5900 Constipation, unspecified: Secondary | ICD-10-CM

## 2023-11-04 DIAGNOSIS — Z860102 Personal history of hyperplastic colon polyps: Secondary | ICD-10-CM

## 2023-11-04 DIAGNOSIS — Z8619 Personal history of other infectious and parasitic diseases: Secondary | ICD-10-CM

## 2023-11-04 DIAGNOSIS — G8929 Other chronic pain: Secondary | ICD-10-CM

## 2023-11-04 DIAGNOSIS — K219 Gastro-esophageal reflux disease without esophagitis: Secondary | ICD-10-CM

## 2023-11-04 MED ORDER — FAMOTIDINE 20 MG PO TABS: 20.0000 mg | ORAL_TABLET | Freq: Two times a day (BID) | ORAL | 1 refills | Status: DC

## 2023-11-04 NOTE — Progress Notes (Signed)
11/04/2023 Mikayla Hart [id number] January 25, 1961   CHIEF COMPLAINT: Upper abdominal pain  HISTORY OF PRESENT ILLNESS: Mikayla Hart is a 63 year old female with a past medical history of anxiety, osteoarthritis, Sjogren's disease, hypertension, GERD, cyclic nausea vomiting syndrome and colon polyps. Cauda equina syndrome L4-L5 broad-based herniation with severe spinal stenosis + central stenosis C4, left-sided foraminal stenosis C6 and 7 status post laminectomy 05/30/2020 and L4-L5 lumbar laminectomy.  She presents to our office today as referred by Melissa Montane PA-C for further evaluation regarding chronic upper abdominal pain and to schedule a screening colonoscopy. She endorses having epigastric pain for several years which has progressively worsened since 09/27/2023.  She has a history of H. pylori which was treated once or twice but she cannot recall the date.  She underwent an upper endoscopy possibly in 2021 by GI in [facility].  She has a history of colon polyps, she cannot recall if she underwent 1 or 2 colonoscopies in the past.  Her most recent colonoscopy was 8 to 10 years ago completed by GI in [facility] which she stated 2 or 3 polyps were removed from the colon.  No known family history of colon polyps or colorectal cancer.  She describes having palpitations or heart fluttering which comes and goes and occurs briefly most days.  She stated she saw cardiologist 2 years ago and underwent a Holter monitor for at least 1 week which showed some irregularity, possible PVC for which she was prescribed low-dose metoprolol.  She denies history of atrial fibrillation.  She is not on any blood thinners.  She had an episode of heart fluttering yesterday which lasted 5 minutes and felt different than her prior episodes, she experienced a burning pain in the left chest which lasted for approximately 5 minutes.  No associated dizziness or shortness of breath.  No chest pain or palpitations/fluttering  today.  She also stated having 2 syncopal episodes which occurred 6 months apart 3 years ago without undergoing evaluation.  She has a history of GERD x 20 years for which she takes Pantoprazole 40 mg every other day.  She takes Celebrex 4 days weekly for the past month back or feet pain.  She had epigastric pain with vomiting 09/2023, no hematemesis.  She went to [facility] ED 09/29/2023.  She stated and RUQ sonogram was normal.  She also noted losing 10 pounds within 2 to 3 days at that time.  She has chronic constipation, describes passing pellet-like stools most days.  Sometimes she skips a few days without passing a bowel movement then passes pellet-like stool then diarrhea then no BM for few days and the cycle repeats.  Water intake is low.  She drinks 32 ounces of coffee daily.  She is a Designer, jewellery.  Labs 10/03/2023: WBC 7.5.  Hemoglobin 12.3.  Platelet 240.  Glucose 91.  BUN 13.  Creatinine 0.81.  Sodium 142.  Potassium 3.6.  Total bili 0.3.  Alk phos 100.  AST 25.  ALT 26.  Lipase 30.  Hemoglobin A1c 6.1.  Review of GI procedure records and image studies in Care Everywhere as follows:  EGD 01/12/2021: Findings  The upper third of the esophagus, middle third of the esophagus, lower  third of the esophagus, GE junction and Z-line appeared normal.  Gastric exam shows antral erosions.  Retroflex view is normal.  Random  biopsies obtained from antrum and body for H-pylori.  The duodenal bulb and 2nd part of the duodenum  appeared normal.   Impression  - Mild gastric erosions, this could be NSAIDs related although H-pylori  infection cannot be excluded  - Source of nausea and vomiting chronically can be associated with chronic  use of marijuana.  Gastritis can also exacerbate symptoms. BENIGN GASTRIC MUCOSA SHOWING NO SIGNIFICANT HISTOPATHOLOGICAL CHANGES. THE FEATURES OF HELICOBACTER PYLORI ASSOCIATED GASTRITIS ARE NOT IDENTIFIED.   EGD 03/09/2016 at [facility]: EGD procedure  report not found in care everywhere.   Path report: Esophagus biopsies fragments of unremarkable esophageal mucosa no evidence of eosinophilic esophagitis.  Colonoscopy 03/09/2016 at [facility]: Colonoscopy procedure report not available in care everywhere.   Path report: Biopsies large intestine, rectum consistent with hyperplastic polyp. Recall colonoscopy 10 years  Gastric emptying study [facility] healthcare 06/22/2016: Normal.   RUQ sonogram 09/23/2023: FINDINGS: There is no gallbladder wall thickening with cross- sectional measurement of 2 mm. No calculi visualized in the gallbladder lumen. No pericholecystic fluid. The technologist reports a negative sonographic Murphy sign. The common bile duct is not dilated, measuring 2 mm in diameter.  IMPRESSION: No sonographic evidence of cholelithiasis, acute cholecystitis or biliary obstruction       Latest Ref Rng & Units 10/03/2023    2:30 PM 11/06/2020    9:00 PM 06/05/2020    4:48 AM  CBC  WBC 3.4 - 10.8 x10E3/uL 7.5  6.8  9.1   Hemoglobin 11.1 - 15.9 g/dL 16.0  73.7  10.6   Hematocrit 34.0 - 46.6 % 40.8  42.9  39.8   Platelets 150 - 450 x10E3/uL 240  236  238        Latest Ref Rng & Units 10/03/2023    2:30 PM 11/06/2020    9:00 PM 06/05/2020    4:48 AM  CMP  Glucose 70 - 99 mg/dL 91  269  94   BUN 8 - 27 mg/dL 13  9  18    Creatinine 0.57 - 1.00 mg/dL 4.85  4.62  7.03   Sodium 134 - 144 mmol/L 142  139  137   Potassium 3.5 - 5.2 mmol/L 3.6  4.0  4.0   Chloride 96 - 106 mmol/L 100  99  103   CO2 20 - 29 mmol/L 28  23  27    Calcium 8.7 - 10.3 mg/dL 50.0  93.8  9.0   Total Protein 6.0 - 8.5 g/dL 6.2  9.3  6.3   Total Bilirubin 0.0 - 1.2 mg/dL 0.3  1.0  1.0   Alkaline Phos 44 - 121 IU/L 100  100  65   AST 0 - 40 IU/L 25  35  17   ALT 0 - 32 IU/L 26  16  15       Past Medical History:  Diagnosis Date   Acid reflux    Hypertension    Osteoarthritis    Sjogren's disease (HCC)    Past Surgical History:  Procedure Laterality  Date   ABDOMINAL HYSTERECTOMY     BACK SURGERY     2   BUNIONECTOMY     CESAREAN SECTION     3   HAMMER TOE SURGERY     ORTHOPEDIC SURGERY     SALPINGECTOMY     2010   Social History: She is married.  She is a Designer, jewellery.  She has 1 son and 4 daughters.  Past smoker.  Infrequent alcohol use.  She smokes marijuana.   Family History: Mother with history of heart disease, hypertension, CVA and diabetes.  Father with glaucoma and hypercholesterolemia.  Maternal grandmother possibly had liver cancer.  No known family history of esophageal, gastric or colon cancer.  Allergies  Allergen Reactions   Morphine Itching   Amlodipine Cough   Clonidine Other (See Comments)    Other reaction(s): Other (See Comments)  Makes her sleepy and very "drugged up"  unknown  Unable to awake   Tramadol Nausea And Vomiting and Nausea Only   Sulfa Antibiotics Other (See Comments)    Eyedrops makes eyes swell   Tetracyclines & Related       Outpatient Encounter Medications as of 11/04/2023  Medication Sig   acetaminophen (TYLENOL) 500 MG tablet Take 500 mg by mouth every 6 (six) hours as needed for mild pain or headache.   celecoxib (CELEBREX) 200 MG capsule Take 200 mg by mouth 2 (two) times daily.   chlorthalidone (HYGROTON) 25 MG tablet Take 1 tablet (25 mg total) by mouth daily. (Patient taking differently: Take 50 mg by mouth daily.)   Cholecalciferol 50 MCG (2000 UT) TABS Take 2,000 Units by mouth daily.    gabapentin (NEURONTIN) 100 MG capsule Take 100-300 mg by mouth as needed. 100mg  in the morning and 300mg  at bedtime   losartan (COZAAR) 100 MG tablet Take 100 mg by mouth daily.    ondansetron (ZOFRAN ODT) 8 MG disintegrating tablet Take 1 tablet (8 mg total) by mouth every 8 (eight) hours as needed for nausea or vomiting.   pantoprazole (PROTONIX) 40 MG tablet Take 40 mg by mouth daily.   Potassium Chloride ER 20 MEQ TBCR Take 40 mEq by mouth daily.    promethazine (PHENERGAN) 12.5 MG  suppository Place 12.5 mg rectally as needed.   No facility-administered encounter medications on file as of 11/04/2023.   REVIEW OF SYSTEMS:  Gen:+ Night sweats.  CV: See HPI. + Leg swelling. Resp: Denies cough, shortness of breath of hemoptysis.  GI: Denies heartburn, dysphagia, stomach or lower abdominal pain. No diarrhea or constipation.  GU: Denies urinary burning, blood in urine, increased urinary frequency or incontinence. MS: + Back pain. Derm: Denies rash, itchiness, skin lesions or unhealing ulcers. Psych: Denies depression, anxiety, memory loss or confusion. Heme: Denies bruising, easy bleeding. Neuro:  Denies headaches, dizziness or paresthesias. Endo:  + Headaches.  PHYSICAL EXAM: BP 130/80   Pulse 68   Ht 5\' 2"  (1.575 m)   Wt 170 lb (77.1 kg)   BMI 31.09 kg/m   General: 63 year old female in no acute distress. Head: Normocephalic and atraumatic. Eyes:  Sclerae non-icteric, conjunctive pink. Ears: Normal auditory acuity. Mouth: Upper denture plate.  No ulcers or lesions.  Neck: Supple, no lymphadenopathy or thyromegaly.  Lungs: Clear bilaterally to auscultation without wheezes, crackles or rhonchi. Heart: Regular rate and rhythm. No murmur, rub or gallop appreciated.  Abdomen: Soft, nondistended. Moderate epigastric tenderness. No masses. No hepatosplenomegaly. Normoactive bowel sounds x 4 quadrants.  Rectal: Deferred.  Musculoskeletal: Symmetrical with no gross deformities. Skin: Warm and dry. No rash or lesions on visible extremities. Extremities: No edema. Neurological: Alert oriented x 4, no focal deficits.  Psychological:  Alert and cooperative. Normal mood and affect.  ASSESSMENT AND PLAN:  History of colon polyps. Colonoscopy 03/2016 identified 1 hyperplastic polyp Next colonoscopy due 03/2026  GERD, history of H. Pylori with chronic epigastric pain.  EGD 01/12/2021 showed antral erosions, biopsies negative for H. pylori.  RUQ sonogram 09/2023 showed a  normal gallbladder and liver. Diatherix H. Pylori stool antigen EGD benefits and risks discussed including  risk with sedation, risk of bleeding, perforation and infection  Schedule EGD after cardiac clearance received Increase Pantoprazole 40 mg to once daily GERD diet handout  Palpitations/chest pain.  Patient previously underwent cardiac evaluation at [facility] 2020 11-2021.  She wishes to see a new cardiologist with [facility] cardiology. -Cardiac clearance required prior to scheduling EGD and colonoscopy -Patient was instructed to go to the emergency room if she has recurrent chest pain or prolonged palpitations, dizziness or shortness of breath  Constipation.  Patient dislikes MiraLAX. -Increase water intake to 64 ounces daily -Senna laxative 2 tabs every third night as needed    CC:  No ref. provider found

## 2023-11-04 NOTE — Progress Notes (Signed)
I agree with the assessment and plan as outlined by Ms. Kennedy-Smith. 

## 2023-11-04 NOTE — Patient Instructions (Addendum)
Cardiac clearance is required prior to scheduling an endoscopy and colonoscopy.  Stop Pantoprazole.  Senna Laxative- take 2 tablets every 3rd night as needed  Increase water intake to 64 ounces daily  Your provider has ordered "Diatherix" stool testing for you. You have received a kit from our office today containing all necessary supplies to complete this test. Please carefully read the stool collection instructions provided in the kit before opening the accompanying materials. In addition, be sure to place the label from the top right corner of the laboratory request sheet onto the "puritan opti-swab" tube that is supplied in the kit. This label should include your full name and date of birth. After completing the test, you should secure the purtian tube into the specimen biohazard bag. The laboratory request information sheet (including date and time of specimen collection) should be placed into the outside pocket of the specimen biohazard bag and returned to the [place] lab with 2 days of collection.   If the laboratory information sheet specimen date and time are not filled out, the test will NOT be performed.   Due to recent changes in healthcare laws, you may see the results of your imaging and laboratory studies on MyChart before your provider has had a chance to review them.  We understand that in some cases there may be results that are confusing or concerning to you. Not all laboratory results come back in the same time frame and the provider may be waiting for multiple results in order to interpret others.  Please give [place] 48 hours in order for your provider to thoroughly review all the results before contacting the office for clarification of your results.    Thank you for trusting me with your gastrointestinal care!   Alcide Evener, CRNP

## 2023-11-08 ENCOUNTER — Encounter: Payer: Self-pay | Admitting: Cardiology

## 2023-11-08 DIAGNOSIS — M1990 Unspecified osteoarthritis, unspecified site: Secondary | ICD-10-CM | POA: Insufficient documentation

## 2023-11-09 NOTE — Progress Notes (Signed)
 Cardiology Office Note:    Date:  11/10/2023   ID:  Mikayla Hart, DOB [birth year], MRN [id number]  PCP:  Mikayla Lang DASEN, PA-C  Cardiologist:  Redell Leiter, MD   Referring MD: Mikayla Hart *  ASSESSMENT:    1. Palpitations   2. Syncope and collapse   3. Chest pain of uncertain etiology   4. Essential hypertension    PLAN:    In order of problems listed above:  Ongoing intermittent symptoms she is frustrated by the failure of previous parameters opts to use her smart plan to see her in 3 months and if she captures symptomatic events she can send them to me through MyChart Presently not having chest pain and would like to defer diagnostic cardiac imaging Continue current treatment she does not want to switch chlorthalidone  and MRA to avoid hypokalemia  Next appointment 3 months   Medication Adjustments/Labs and Tests Ordered: Current medicines are reviewed at length with the patient today.  Concerns regarding medicines are outlined above.  Orders Placed This Encounter  Procedures   EKG 12-Lead   No orders of the defined types were placed in this encounter.    No chief complaint on file.   History of Present Illness:    Mikayla Hart is a 63 y.o. female with a history of hypertension Sjogren's syndrome and gastroesophageal reflux disease who is being seen today for the evaluation of palpitation and chest pain at the request of Mikayla Hart *.  Was seen at [facility] cardiology 09/24/2021 for palpitation syncope an echocardiogram performed showing normal chamber size normal left and right ventricular function normal valve function and Doppler exam she did have moderate concentric LVH.  She had an event monitor performed January 2023 which showed no ventricular arrhythmia no supraventricular arrhythmia no bradycardia it was described as normal.    I reviewed her previous testing with her. He is very frustrated she continues to have  episodes of heart rhythm.  Some of it is a brief forceful contraction she has also had episodes where her heart feels like it is racing away and it occurs with and without activities and when it occurs it is brief but at times has forced her to stop and pause She takes no over-the-counter proarrhythmic drugs She is on ambulatory monitors that have not captured symptomatic events She is not having edema orthopnea chest pain or syncope She takes magnesium  over-the-counter and has chronically low potassium on a thiazide diuretic but does not want to take MRA She had this intermittent cyclic nausea and vomiting this been a chronic problem without a precise diagnosis  In some she is very frustrated that we cannot capture her arrhythmia With a recent flare offered an ambulatory monitor she declined She has a smart [employer] watch I explained to her that it is a medical quality heart monitor and she would like to use that to capture episodes and send them to me through MyChart and follow-up in 3 months 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 that is necessary to carry the phone to have the watch and hopefully she can use it while at work She does not want a repeat cardiac imaging at this time He is having an extensive rheumatologic evaluation presently Takes over-the-counter magnesium  and asked her to continue oftentimes can be quite helpful  Past Medical History:  Diagnosis Date   Acid reflux    Acute encephalopathy 06/01/2020   Acute metabolic encephalopathy 06/02/2020  Benign essential tremor 05/18/2019   Cervical pain 07/16/2019   Chronic upper abdominal pain 10/03/2023   Degenerative spondylolisthesis 08/13/2020   Added automatically from request for surgery [id number]     Essential hypertension 06/02/2020   Gastroesophageal reflux disease without esophagitis 07/18/2019   Goiter 10/03/2023   Hypertension    Hypokalemia 06/02/2020   Hypomagnesemia 06/02/2020   Lumbar radiculopathy  01/10/2018   Microcytosis 05/18/2019   Microscopic hematuria 04/11/2023   Nausea and vomiting 10/03/2023   Non-intractable vomiting 06/02/2020   Osteoarthritis    Pulmonary hypertension (HCC) 05/18/2019   Mild, RVSP 29.4 mmHg 2018 TTE     Risk factors for obstructive sleep apnea 05/22/2020   DO I SNORE SCORE of 5, sleep referral declined (05/22/2020)     S/P lumbar fusion 10/28/2020   Screen for colon cancer 10/03/2023   Situational mixed anxiety and depressive disorder 08/02/2018   Sjogren's disease (HCC)    Spinal stenosis of lumbar region 07/16/2019   Vitamin D deficiency 02/27/2016    Past Surgical History:  Procedure Laterality Date   ABDOMINAL HYSTERECTOMY     BACK SURGERY     2   BUNIONECTOMY     CESAREAN SECTION     3   HAMMER TOE SURGERY     ORTHOPEDIC SURGERY     SALPINGECTOMY     2010    Current Medications: Current Meds  Medication Sig   celecoxib (CELEBREX) 200 MG capsule Take 200 mg by mouth 2 (two) times daily.   chlorthalidone  (HYGROTON ) 50 MG tablet Take 50 mg by mouth daily.   Cholecalciferol 50 MCG (2000 UT) TABS Take 2,000 Units by mouth daily.    famotidine  (PEPCID ) 20 MG tablet Take 1 tablet (20 mg total) by mouth 2 (two) times daily.   losartan  (COZAAR ) 100 MG tablet Take 100 mg by mouth daily.    ondansetron  (ZOFRAN  ODT) 8 MG disintegrating tablet Take 1 tablet (8 mg total) by mouth every 8 (eight) hours as needed for nausea or vomiting.   Potassium Chloride  ER 20 MEQ TBCR Take 40 mEq by mouth daily.    promethazine  (PHENERGAN ) 12.5 MG suppository Place 12.5 mg rectally as needed.     Allergies:   Morphine, Amlodipine , Clonidine, Tramadol, Sulfa antibiotics, and Tetracyclines & related   Social History   Socioeconomic History   Marital status: Married    Spouse name: Not on file   Number of children: 5   Years of education: Not on file   Highest education level: Not on file  Occupational History   Occupation: [employer]    Comment: LPN   Occupation: public relations account executive  Tobacco Use   Smoking status: Some Days    Types: Cigarettes    Passive exposure: Never   Smokeless tobacco: Never   Tobacco comments:    Smokes tobacco w/ marijuana   Vaping Use   Vaping status: Never Used  Substance and Sexual Activity   Alcohol use: Yes    Comment: rarely   Drug use: Yes    Types: Marijuana   Sexual activity: Not on file  Other Topics Concern   Not on file  Social History Narrative   Not on file   Social Drivers of Health   Financial Resource Strain: Not on file  Food Insecurity: Not on file  Transportation Needs: Not on file  Physical Activity: Not on file  Stress: Not on file  Social Connections: Unknown (02/12/2022)   [facility]   Social Network    Social Network: Not on file     Family History: The patient's family history includes Diabetes type II in her mother; Glaucoma in her father; Heart disease in her mother; Hypercholesterolemia in her father; Hypertension in her mother; Stroke in her mother.  ROS:   ROS Please see the history of present illness.     All other systems reviewed and are negative.  EKGs/Labs/Other Studies Reviewed:    The following studies were reviewed today:    Physical Exam:    VS:  BP 124/86   Pulse 80   Ht 5' 2 (1.575 m)   Wt 167 lb 3.2 oz (75.8 kg)   SpO2 96%   BMI 30.58 kg/m     Wt Readings from Last 3 Encounters:  11/10/23 167 lb 3.2 oz (75.8 kg)  11/04/23 170 lb (77.1 kg)  10/03/23 174 lb 4.8 oz (79.1 kg)     GEN:  Well nourished, well developed in no acute distress HEENT: Normal NECK: No JVD; No carotid bruits LYMPHATICS: No lymphadenopathy CARDIAC: RRR, no murmurs, rubs, gallops RESPIRATORY:  Clear to auscultation without rales, wheezing or rhonchi  ABDOMEN: Soft, non-tender, non-distended MUSCULOSKELETAL:  No edema; No deformity  SKIN: Warm and dry NEUROLOGIC:  Alert and oriented x  3 PSYCHIATRIC:  Normal affect     Signed, Redell Leiter, MD  11/10/2023 5:03 PM    [facility]

## 2023-11-10 ENCOUNTER — Encounter: Payer: Self-pay | Admitting: Cardiology

## 2023-11-10 ENCOUNTER — Ambulatory Visit: Payer: Self-pay | Attending: Cardiology | Admitting: Cardiology

## 2023-11-10 VITALS — BP 124/86 | HR 80 | Ht 62.0 in | Wt 167.2 lb

## 2023-11-10 DIAGNOSIS — R079 Chest pain, unspecified: Secondary | ICD-10-CM | POA: Diagnosis not present

## 2023-11-10 DIAGNOSIS — R002 Palpitations: Secondary | ICD-10-CM

## 2023-11-10 DIAGNOSIS — I10 Essential (primary) hypertension: Secondary | ICD-10-CM

## 2023-11-10 DIAGNOSIS — R55 Syncope and collapse: Secondary | ICD-10-CM | POA: Diagnosis not present

## 2023-11-10 NOTE — Patient Instructions (Signed)
Medication Instructions:  Your physician recommends that you continue on your current medications as directed. Please refer to the Current Medication list given to you today.  *If you need a refill on your cardiac medications before your next appointment, please call your pharmacy*   Lab Work: None If you have labs (blood work) drawn today and your tests are completely normal, you will receive your results only by: MyChart Message (if you have MyChart) OR A paper copy in the mail If you have any lab test that is abnormal or we need to change your treatment, we will call you to review the results.   Testing/Procedures: None   Follow-Up: At [facility], you and your health needs are our priority.  As part of our continuing mission to provide you with exceptional heart care, we have created designated Provider Care Teams.  These Care Teams include your primary Cardiologist (physician) and Advanced Practice Providers (APPs -  Physician Assistants and Nurse Practitioners) who all work together to provide you with the care you need, when you need it.  We recommend signing up for the patient portal called "MyChart".  Sign up information is provided on this After Visit Summary.  MyChart is used to connect with patients for Virtual Visits (Telemedicine).  Patients are able to view lab/test results, encounter notes, upcoming appointments, etc.  Non-urgent messages can be sent to your provider as well.   To learn more about what you can do with MyChart, go to [email or web address].    Your next appointment:   3 month(s)  Provider:   Brian Munley, MD    Other Instructions None  

## 2023-11-14 ENCOUNTER — Ambulatory Visit (HOSPITAL_BASED_OUTPATIENT_CLINIC_OR_DEPARTMENT_OTHER): Payer: BC Managed Care – PPO | Admitting: Student

## 2023-11-14 ENCOUNTER — Encounter (HOSPITAL_BASED_OUTPATIENT_CLINIC_OR_DEPARTMENT_OTHER): Payer: Self-pay | Admitting: Student

## 2023-11-14 VITALS — BP 130/90 | HR 91 | Temp 98.5°F | Ht 61.81 in | Wt 167.1 lb

## 2023-11-14 DIAGNOSIS — G8929 Other chronic pain: Secondary | ICD-10-CM

## 2023-11-14 DIAGNOSIS — K219 Gastro-esophageal reflux disease without esophagitis: Secondary | ICD-10-CM

## 2023-11-14 DIAGNOSIS — I10 Essential (primary) hypertension: Secondary | ICD-10-CM | POA: Diagnosis not present

## 2023-11-14 DIAGNOSIS — R1010 Upper abdominal pain, unspecified: Secondary | ICD-10-CM | POA: Diagnosis not present

## 2023-11-14 NOTE — Assessment & Plan Note (Signed)
 Notes that BP has been better while at home. Stable, continue current regimen.

## 2023-11-14 NOTE — Patient Instructions (Signed)
 It was nice to see you today!  Counseling Resource: [facility] [address]  [place], [place] [address]  Phone: [phone]  Fax: [phone]   If you have any problems before your next visit feel free to message me via MyChart (minor issues or questions) or call the office, otherwise you may reach out to schedule an office visit.  Thank you! Dewitte Vannice, PA-C

## 2023-11-14 NOTE — Progress Notes (Signed)
 Established Patient Office Visit  Subjective   Patient ID: Mikayla Hart, female    DOB: 02/22/1961  Age: 63 y.o. MRN: [id number]  Chief Complaint  Patient presents with   Follow-up    Pt. Here for a follow-up visit.     HPI  Palpitations- Seen by cardiology due to history of palpitations. Has had several normal holters in the past. Was cleared by cardiology for EGD.  Epigastric pain/GERD- Abdomen is still hurting. Notes that it is mainly after she eats. Sees GI again in 3.5 weeks. GI wanted her to do Senna every third day but she did not do this. She has been consuming veggie smoothies daily which have helped. Appetite has been pretty good but she knows that her belly will hurt. Notes that she is still getting some nausea but without vomiting. Still smoking Marijuana. GI increased PPI to 40mg  daily.  Foot pain- Follows with podiatry. Patient notes that she is currently taking celebrex which is currently helping.   Hypertension- Pt denies SOB, dizziness, or heart palpitations.  Taking meds w/o problems.  Denies medication side effects. Feels that it is better when she takes it at home.     ROS Negative unless indicated in HPI.    Objective:     BP (!) 130/90 (BP Location: Right Arm, Patient Position: Sitting, Cuff Size: Normal)   Pulse 91   Temp 98.5 F (36.9 C) (Oral)   Ht 5' 1.81" (1.57 m)   Wt 167 lb 1.6 oz (75.8 kg)   SpO2 98%   BMI 30.75 kg/m    Physical Exam Constitutional:      General: She is not in acute distress.    Appearance: Normal appearance. She is not ill-appearing.  HENT:     Head: Normocephalic and atraumatic.     Right Ear: External ear normal.     Left Ear: External ear normal.     Nose: Nose normal.  Eyes:     General: No scleral icterus.    Extraocular Movements: Extraocular movements intact.     Conjunctiva/sclera: Conjunctivae normal.     Pupils: Pupils are equal, round, and reactive to light.  Cardiovascular:     Rate and Rhythm:  Normal rate and regular rhythm.     Pulses: Normal pulses.     Heart sounds: Normal heart sounds. No murmur heard.    No friction rub.  Pulmonary:     Effort: Pulmonary effort is normal. No respiratory distress.     Breath sounds: Normal breath sounds. No wheezing, rhonchi or rales.  Musculoskeletal:        General: Normal range of motion.  Neurological:     General: No focal deficit present.     Mental Status: She is alert.     Deep Tendon Reflexes: Reflexes normal.  Psychiatric:        Mood and Affect: Mood normal.        Behavior: Behavior normal.      No results found for any visits on 11/14/23.       Assessment & Plan:   Essential hypertension Assessment & Plan: Notes that BP has been better while at home. Stable, continue current regimen.   Chronic upper abdominal pain Assessment & Plan: Has reduced marijuana use.  Following with GI. Continue PPI therapy.   Gastroesophageal reflux disease without esophagitis Assessment & Plan: Continue PPI therapy. Continue to follow with GI. GI has order H Pylori Testing    I have spent greater than 30  minutes charting, educating, diagnosing and managing this patient for this visit.    Return in about 3 months (around 02/11/2024) for Chronic Followup.    Claretta Kendra T Terrea Bruster, PA-C

## 2023-11-14 NOTE — Assessment & Plan Note (Signed)
 Has reduced marijuana use.  Following with GI. Continue PPI therapy.

## 2023-11-14 NOTE — Assessment & Plan Note (Signed)
 Continue PPI therapy. Continue to follow with GI. GI has order H Pylori Testing

## 2023-11-17 ENCOUNTER — Ambulatory Visit: Payer: BC Managed Care – PPO | Admitting: Urology

## 2023-12-07 ENCOUNTER — Ambulatory Visit (HOSPITAL_BASED_OUTPATIENT_CLINIC_OR_DEPARTMENT_OTHER): Admitting: Student

## 2023-12-07 ENCOUNTER — Encounter (HOSPITAL_BASED_OUTPATIENT_CLINIC_OR_DEPARTMENT_OTHER): Payer: Self-pay | Admitting: Student

## 2023-12-07 VITALS — BP 166/94 | HR 73 | Temp 98.4°F | Ht 61.0 in | Wt 174.0 lb

## 2023-12-07 DIAGNOSIS — K047 Periapical abscess without sinus: Secondary | ICD-10-CM | POA: Diagnosis not present

## 2023-12-07 MED ORDER — AMOXICILLIN-POT CLAVULANATE 875-125 MG PO TABS: 1.0000 | ORAL_TABLET | Freq: Two times a day (BID) | ORAL | 0 refills | Status: AC

## 2023-12-07 MED ORDER — AMOXICILLIN-POT CLAVULANATE 875-125 MG PO TABS
1.0000 | ORAL_TABLET | Freq: Two times a day (BID) | ORAL | 0 refills | Status: DC
Start: 2023-12-07 — End: 2023-12-07

## 2023-12-07 MED ORDER — ACETAMINOPHEN-CODEINE 300-30 MG PO TABS: 1.0000 | ORAL_TABLET | ORAL | 0 refills | Status: AC | PRN

## 2023-12-07 MED ORDER — ACETAMINOPHEN-CODEINE 300-30 MG PO TABS: 1.0000 | ORAL_TABLET | ORAL | 0 refills | Status: DC | PRN

## 2023-12-07 NOTE — Progress Notes (Signed)
   Acute Office Visit  Subjective:     Patient ID: Mikayla Hart, female    DOB: 16-Jul-1961, 63 y.o.   MRN: [id number]  Chief Complaint  Patient presents with   Cyst    Has abscess/knot on right side of cheek since today at work. Glands were hurting Saturday. Could not get insurance card for dentist so they would not see pt.    HPI  Jaw pain/Dental Infection- Patient notes that she has an "abscess" on the right side of her cheek. She states that her "glands" in her neck were hurting since Saturday. R sided molar with broken tooth and broken filling for the last 6 months.  She states that the swelling on the right side of her jaw has been going on for 1 day.  She suspects that she may have a dental infection but could not get in with her dentist due to insurance issues.  Discussed and encouraged getting in with a dentist ASAP.  Discussed treatment options prior to getting in with a dentist.  ROS Per hpi     Objective:    BP (!) 166/94 (BP Location: Left Arm, Patient Position: Sitting, Cuff Size: Normal)   Pulse 73   Temp 98.4 F (36.9 C) (Oral)   Ht 5\' 1"  (1.549 m)   Wt 174 lb (78.9 kg)   SpO2 97%   BMI 32.88 kg/m    Physical Exam Constitutional:      Appearance: Normal appearance.  HENT:     Head: Normocephalic and atraumatic.     Right Ear: External ear normal.     Left Ear: External ear normal.     Nose: Nose normal.     Mouth/Throat:     Mouth: Mucous membranes are moist.     Pharynx: Oropharynx is clear. No oropharyngeal exudate or posterior oropharyngeal erythema.     Comments: Swelling on the right side jaw.  Pain to palpation.  Inside mouth back right molar appears to have major cavity-consistent with broken filling.  Swelling between cheek and gums on right side-pain to light touch of the area. Eyes:     Conjunctiva/sclera: Conjunctivae normal.  Cardiovascular:     Rate and Rhythm: Normal rate.  Musculoskeletal:        General: Normal range of motion.      Cervical back: Tenderness present. No rigidity.  Lymphadenopathy:     Cervical: Cervical adenopathy present.  Skin:    General: Skin is warm and dry.     Coloration: Skin is not jaundiced or pale.  Neurological:     General: No focal deficit present.     Mental Status: She is alert.  Psychiatric:        Mood and Affect: Mood normal.        Behavior: Behavior normal.     No results found for any visits on 12/07/23.      Assessment & Plan:   Dental Infection-concern for dental abscess formation -Order Augmentin to be taken twice daily for 7 days - Provided with Tylenol #3 x 15 for pain that prior to getting in with the dentist. - Encourage patient to follow-up with dentist ASAP  I have spent greater than 30 minutes charting, educating, diagnosing and managing this patient for this visit.   Return if symptoms worsen or fail to improve.  Teryl Lucy Retha Bither, PA-C

## 2023-12-07 NOTE — Patient Instructions (Signed)
 It was nice to see you today!  I am sending in augmentin for 7 days and a pain medication. Please take no other pain medications while you're on this. The medications may all cause you to become nauseated. If the infection gets much worse and you cannot get into a dentist you may have to go to the ER. Please make a dentist appointment ASAP.  If you have any problems before your next visit feel free to message me via MyChart (minor issues or questions) or call the office, otherwise you may reach out to schedule an office visit.  Thank you! Gerilyn Pilgrim Dakoda Bassette, PA-C

## 2023-12-14 ENCOUNTER — Emergency Department (HOSPITAL_COMMUNITY)
Admission: EM | Admit: 2023-12-14 | Discharge: 2023-12-14 | Disposition: A | Discharge status: Home / Self Care | Attending: Emergency Medicine | Admitting: Emergency Medicine

## 2023-12-14 ENCOUNTER — Other Ambulatory Visit: Payer: Self-pay

## 2023-12-14 ENCOUNTER — Encounter (HOSPITAL_COMMUNITY): Payer: Self-pay

## 2023-12-14 DIAGNOSIS — K047 Periapical abscess without sinus: Secondary | ICD-10-CM | POA: Insufficient documentation

## 2023-12-14 MED ORDER — CLINDAMYCIN HCL 150 MG PO CAPS
150.0000 mg | ORAL_CAPSULE | Freq: Four times a day (QID) | ORAL | 0 refills | Status: DC
Start: 2023-12-14 — End: 2024-03-09

## 2023-12-14 NOTE — Discharge Instructions (Signed)
 Return for any problem.  ?

## 2023-12-14 NOTE — ED Provider Notes (Signed)
 [facility] AT [facility] Provider Note   CSN: [id number] Arrival date & time: 12/14/23  1108     History  Chief Complaint  Patient presents with   Abscess   Oral Swelling    Mikayla Hart is a 63 y.o. female.  63 year old female with prior medical history as detailed below presents for evaluation.  Patient reports right lower jaw pain and suspected dental abscess x 1 week.  She is reporting that she finished taking Augmentin for that today.  She was advised by her dentist to continue antibiotics until next week when she was going to be seen again.  She denies fever.  She denies difficulty speaking or swallowing.  The history is provided by the patient and medical records.       Home Medications Prior to Admission medications   Medication Sig Start Date End Date Taking? Authorizing Provider  clindamycin (CLEOCIN) 150 MG capsule Take 1 capsule (150 mg total) by mouth every 6 (six) hours. 12/14/23  Yes Wynetta Fines, MD  amoxicillin-clavulanate (AUGMENTIN) 875-125 MG tablet Take 1 tablet by mouth 2 (two) times daily for 7 days. 12/07/23 12/14/23  Rothfuss, Teryl Lucy, PA-C  celecoxib (CELEBREX) 200 MG capsule Take 200 mg by mouth 2 (two) times daily. 09/16/23   [provider]  chlorthalidone (HYGROTON) 50 MG tablet Take 50 mg by mouth daily.    [provider]  Cholecalciferol 50 MCG (2000 UT) TABS Take 2,000 Units by mouth daily.  07/07/17   [provider]  famotidine (PEPCID) 20 MG tablet Take 1 tablet (20 mg total) by mouth 2 (two) times daily. 11/04/23   Arnaldo Natal, NP  gabapentin (NEURONTIN) 300 MG capsule Take 300 mg by mouth 3 (three) times daily.    [provider]  losartan (COZAAR) 100 MG tablet Take 100 mg by mouth daily.  03/16/19   [provider]  ondansetron (ZOFRAN ODT) 8 MG disintegrating tablet Take 1 tablet (8 mg total) by mouth every 8 (eight) hours as needed for nausea or  vomiting. 11/07/20   Molpus, John, MD  Potassium Chloride ER 20 MEQ TBCR Take 40 mEq by mouth daily.  04/25/20   [provider]  promethazine (PHENERGAN) 12.5 MG suppository Place 12.5 mg rectally as needed.    [provider]      Allergies    Morphine, Amlodipine, Clonidine, Tramadol, Sulfa antibiotics, and Tetracyclines & related    Review of Systems   Review of Systems  All other systems reviewed and are negative.   Physical Exam Updated Vital Signs BP (!) 158/97 (BP Location: Right Arm)   Pulse 98   Temp 97.9 F (36.6 C) (Oral)   Resp 16   Ht 5\' 2"  (1.575 m)   Wt 77.6 kg   SpO2 100%   BMI 31.28 kg/m  Physical Exam Vitals and nursing note reviewed.  Constitutional:      General: She is not in acute distress.    Appearance: Normal appearance. She is well-developed.  HENT:     Head: Normocephalic and atraumatic.     Mouth/Throat:      Comments: Patient with advanced dental decay's throughout her oropharynx.  Patient with softly fluctuant area along the right lower dentition line.  Consistent with likely dental abscess. Eyes:     Conjunctiva/sclera: Conjunctivae normal.     Pupils: Pupils are equal, round, and reactive to light.  Cardiovascular:     Rate and Rhythm: Normal rate and  regular rhythm.     Heart sounds: Normal heart sounds.  Pulmonary:     Effort: Pulmonary effort is normal. No respiratory distress.     Breath sounds: Normal breath sounds.  Abdominal:     General: There is no distension.     Palpations: Abdomen is soft.     Tenderness: There is no abdominal tenderness.  Musculoskeletal:        General: No deformity. Normal range of motion.     Cervical back: Normal range of motion and neck supple.  Skin:    General: Skin is warm and dry.  Neurological:     General: No focal deficit present.     Mental Status: She is alert and oriented to person, place, and time.     ED Results / Procedures / Treatments   Labs (all labs  ordered are listed, but only abnormal results are displayed) Labs Reviewed - No data to display  EKG None  Radiology No results found.  Procedures Procedures    Medications Ordered in ED Medications - No data to display  ED Course/ Medical Decision Making/ A&P                                 Medical Decision Making Risk Prescription drug management.    Medical Screen Complete  This patient presented to the ED with complaint of dental pain.  This complaint involves an extensive number of treatment options. The initial differential diagnosis includes, but is not limited to, dental infection, dental abscess  This presentation is: Acute, Chronic, Self-Limited, Previously Undiagnosed, Uncertain Prognosis, Complicated, Systemic Symptoms, and Threat to Life/Bodily Function  Patient with known history of dental decay presents with complaint of likely dental infection and likely dental abscess.  Exam is suggestive of dental abscess along the right lower dentition.  Patient offered but declines I&D of her dental abscess.  She states that this would "hurt too much".  She is advised that this procedure is typically well-tolerated after numbing of the area.  She again refuses.  She is advised that if she changes her mind about getting an I&D of the dental abscess she should return to see the ED or else follow-up with her dentist.  Patient would benefit from oral antibiotics.  She has completed her course of Augmentin.  I will prescribe her a course of clindamycin.  She has established outpatient dentistry.  She plans on following up with them later this week.  Importance of close follow-up is stressed.  Strict return precautions given to much  Co morbidities that complicated the patient's evaluation  See HPI   Additional history obtained: External records from outside sources obtained and reviewed including prior ED visits and prior Inpatient records.    Problem List / ED  Course:  Dental pain, suspected dental abscess   Reevaluation:  After the interventions noted above, I reevaluated the patient and found that they have: improved   Disposition:  After consideration of the diagnostic results and the patients response to treatment, I feel that the patent would benefit from close outpatient follow-up.          Final Clinical Impression(s) / ED Diagnoses Final diagnoses:  Dental abscess    Rx / DC Orders ED Discharge Orders          Ordered    clindamycin (CLEOCIN) 150 MG capsule  Every 6 hours  12/14/23 1125              Wynetta Fines, MD 12/14/23 1130

## 2023-12-14 NOTE — ED Triage Notes (Signed)
 Pt reports that she has an oral abscess she has been on antibiotics starting last Wednesday. On Monday she was told by dentist to take antibiotics 3x daily. Pt reports that she finished the antibiotics yesterday and woke up today with increased pain and swelling.

## 2024-02-24 ENCOUNTER — Ambulatory Visit (HOSPITAL_BASED_OUTPATIENT_CLINIC_OR_DEPARTMENT_OTHER): Payer: 59 | Admitting: Student

## 2024-02-29 ENCOUNTER — Telehealth (HOSPITAL_BASED_OUTPATIENT_CLINIC_OR_DEPARTMENT_OTHER): Payer: Self-pay | Admitting: Student

## 2024-02-29 ENCOUNTER — Ambulatory Visit: Payer: Self-pay

## 2024-02-29 NOTE — Telephone Encounter (Signed)
 Copied from CRM [id number]. Topic: Appointments - Appointment Info/Confirmation >> Feb 24, 2024  7:34 AM [place] A wrote: Patient called was upset that Agent asked to verify her address. Patient said "Just to cancel a appointment" Patient said that the office gave her a card for May 23,2025 at 8:30AM Agent informed patient that next appointment was scheduled for 03/02/24 at 8:30AM. Patient said not to cancel that appointment and she will show the card when she comes to the office

## 2024-02-29 NOTE — Telephone Encounter (Signed)
 Chief Complaint: L rib pain Symptoms: rib pain, chest pain, SOB on exertion, sensation that her heart is racing Frequency: 3 days Pertinent Negatives: Patient denies dizziness, nausea, vomiting, diaphoresis Disposition: [x] ED /[] Urgent Care (no appt availability in office) / [] Appointment(In office/virtual)/ []  [facility]/ [] Home Care/ [] Refused Recommended Disposition /[] [facility]/ []  Follow-up with PCP Additional Notes: Pt reports L-sided pain under her ribcage for 3 days. Pt states it woke her up at 0100 on 5/25/ Pt states last night the pain was severe and felt like someone was spraying fire across her chest. Pt states the pain under her L ribcage radiates to her chest. Pt states the pain is a "25" when present. Pt denies pain at this time as she is currently sitting and states the pain is worse with movement. Pt also reports SOB with exertion with the pain. Pt states she's never had SOB before. Pt reports she felt like she was going to pass out walking into work today. Pt calls from work.  RN advised the pt she should go to the ED for her symptoms. At first pt declined and stated she would prefer to be seen in the office. Pt states she cannot go to [facility] and prefers [facility]. Then, pt got up and walked into another room to get a pen. When she returned to the phone she said she was short of breath and that she will go to the ED at 1600 when she gets off work today. RN advised the pt it would be better if she could go to the ED sooner. 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  RN advised pt that if she becomes more SOB or her CP worsens, if she develops vomiting, diaphoresis, or dizziness, she needs to call 911. Pt verbalized understanding.     Copied from CRM [id number]. Topic: Clinical - Red Word Triage >> Feb 29, 2024  9:06 AM Rosaria Common wrote: Red Word that prompted transfer to Nurse Triage: Pain under left ribcage Reason  for Disposition  [1] Chest pain (or "angina") comes and goes AND [2] is happening more often (increasing in frequency) or getting worse (increasing in severity)  (Exception: Chest pains that last only a few seconds.)  Answer Assessment - Initial Assessment Questions 1. LOCATION: "Where does it hurt?"       Under her L ribcage 2. RADIATION: "Does the pain go anywhere else?" (e.g., into neck, jaw, arms, back)     "Goes up into my chest" 3. ONSET: "When did the chest pain begin?" (Minutes, hours or days)      5/25 at 0100AM, states it woke her up  4. PATTERN: "Does the pain come and go, or has it been constant since it started?"  "Does it get worse with exertion?"      Worse when she gets up to move, better with rest 5. DURATION: "How long does it last" (e.g., seconds, minutes, hours)     "It just depends, when I take a deep breath I get SOB"; states this has been a couple days, felt like she was going to pass out walking into work  6. SEVERITY: "How bad is the pain?"  (e.g., Scale 1-10; mild, moderate, or severe)    - MILD (1-3): doesn't interfere with normal activities     - MODERATE (4-7): interferes with normal activities or awakens from sleep    - SEVERE (8-10): excruciating pain, unable  to do any normal activities       Denies pain right now but states it is a 25 when pain is present; "it hits hard" 7. CARDIAC RISK FACTORS: "Do you have any history of heart problems or risk factors for heart disease?" (e.g., angina, prior heart attack; diabetes, high blood pressure, high cholesterol, smoker, or strong family history of heart disease)     HTN, pt states she has seen a cardiologist for an abnormal heart rate  8. PULMONARY RISK FACTORS: "Do you have any history of lung disease?"  (e.g., blood clots in lung, asthma, emphysema, birth control pills)     No  9. CAUSE: "What do you think is causing the chest pain?"     Not sure  10. OTHER SYMPTOMS: "Do you have any other symptoms?" (e.g.,  dizziness, nausea, vomiting, sweating, fever, difficulty breathing, cough)       Last night it felt like someone sprayed fire across her chest; "my EKG looked jacked up." Pt works at a prison, is there currently. Last night she denies she was nauseous or SOB. Felt like pressure last night. "Feels like external pain in my chest and internal pain under her rib."  SOB with exertion for a couple days with pain. Denies SOB at rest. Denies dizziness. Denies nausea/vomiting. Endorses feeling like her heart is beating too fast. Pt checked her HR on the phone - 83  Protocols used: Chest Pain-A-AH

## 2024-03-01 ENCOUNTER — Telehealth (HOSPITAL_BASED_OUTPATIENT_CLINIC_OR_DEPARTMENT_OTHER): Payer: Self-pay

## 2024-03-01 NOTE — Telephone Encounter (Signed)
 Spoke to pt and she was seen at [facility] ER. Dx w/ PNA. Was prescribed prednisone & levaquin for 5 days. Per PCP, push out appt for 1 week after finishing antibiotics. Appt r/s for 03/12/2024.

## 2024-03-02 ENCOUNTER — Ambulatory Visit (HOSPITAL_BASED_OUTPATIENT_CLINIC_OR_DEPARTMENT_OTHER): Admitting: Student

## 2024-03-09 ENCOUNTER — Ambulatory Visit (HOSPITAL_BASED_OUTPATIENT_CLINIC_OR_DEPARTMENT_OTHER)
Admission: RE | Admit: 2024-03-09 | Discharge: 2024-03-09 | Disposition: A | Discharge status: Home / Self Care | Source: Ambulatory Visit | Attending: Student | Admitting: Student

## 2024-03-09 ENCOUNTER — Encounter (HOSPITAL_BASED_OUTPATIENT_CLINIC_OR_DEPARTMENT_OTHER): Payer: Self-pay | Admitting: Student

## 2024-03-09 ENCOUNTER — Encounter (HOSPITAL_BASED_OUTPATIENT_CLINIC_OR_DEPARTMENT_OTHER): Payer: Self-pay | Admitting: Radiology

## 2024-03-09 ENCOUNTER — Ambulatory Visit (HOSPITAL_BASED_OUTPATIENT_CLINIC_OR_DEPARTMENT_OTHER): Admitting: Student

## 2024-03-09 VITALS — BP 125/81 | HR 71 | Temp 98.4°F | Resp 16 | Ht 61.0 in | Wt 162.8 lb

## 2024-03-09 DIAGNOSIS — M25511 Pain in right shoulder: Secondary | ICD-10-CM | POA: Diagnosis not present

## 2024-03-09 DIAGNOSIS — Z1231 Encounter for screening mammogram for malignant neoplasm of breast: Secondary | ICD-10-CM

## 2024-03-09 DIAGNOSIS — Z09 Encounter for follow-up examination after completed treatment for conditions other than malignant neoplasm: Secondary | ICD-10-CM

## 2024-03-09 DIAGNOSIS — M25811 Other specified joint disorders, right shoulder: Secondary | ICD-10-CM | POA: Diagnosis not present

## 2024-03-09 DIAGNOSIS — Z1211 Encounter for screening for malignant neoplasm of colon: Secondary | ICD-10-CM | POA: Diagnosis not present

## 2024-03-09 NOTE — Patient Instructions (Signed)
 It was nice to see you today!  As we discussed in clinic:  Please continue the ibuprofen and tylenol  scheduled as you have been.  You may ice your shoulder as needed.  I would like you to let me know how it goes with [facility]!  [facility] [address], [address] [place], [place] [address]  If you have any problems before your next visit feel free to message me via MyChart (minor issues or questions) or call the office, otherwise you may reach out to schedule an office visit.  Thank you! Marylee Belzer, PA-C

## 2024-03-09 NOTE — Progress Notes (Signed)
 Acute Office Visit  Subjective:     Patient ID: Mikayla Hart, female    DOB: 03/23/1961, 63 y.o.   MRN: [id number]  Chief Complaint  Patient presents with   Hospitalization Follow-up    Here for hospital follow up. Had prednisone prescribed and once she stopped, shoulder hurt again. Could not walk next day. Felt like someone was standing on her. Taking ibuprofen 800mg  every 8 hours. Toradol was not helping. Celebrex was not working. Ran out of gabapentin . Not sure if it helped but it did when she was having a lot of tingling.   Medical Management of Chronic Issues    Needs referral for [facility] on colonoscopy, and mammogram.     HPI  Discussed the use of AI scribe software for clinical note transcription with the patient, who gave verbal consent to proceed.  History of Present Illness   Mikayla Hart is a 63 year old female who presents for a hospital follow-up after pneumonia and R shoulder pain.  Following a recent hospitalization for pneumonia, she experiences ongoing shortness of breath, particularly when walking uphill or walking her dogs, necessitating frequent stops to rest. During her pneumonia, she did not have chest pain but felt pressure and spasms under her ribs. She completed a course of prednisone 40 mg for five days. After finishing the prednisone, she felt extremely fatigued and had difficulty moving for two days, though her energy levels have slightly improved since then. She has been forcing herself to eat, which she believes has helped her energy levels.  She is experiencing significant right shoulder pain, described as feeling like a tear. The pain is not relieved by Celebrex, tramadol, Toradol, or a combination of 800 mg ibuprofen and 500 mg Tylenol  every eight hours. 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 She has a history of arthritis and radiculopathy in her neck, causing chronic pain from her neck to her  shoulder. She is a side sleeper but has had to adjust her sleeping position due to the pain.  Her sister has a history of breast cancer and is currently on medication for it. She has dense breast tissue and has previously undergone more frequent mammograms due to a spot found years ago.      ROS Per HPI     Objective:    BP 125/81   Pulse 71   Temp 98.4 F (36.9 C) (Oral)   Resp 16   Ht 5\' 1"  (1.549 m)   Wt 162 lb 12.8 oz (73.8 kg)   SpO2 98%   BMI 30.76 kg/m    Physical Exam Constitutional:      General: She is not in acute distress.    Appearance: Normal appearance. She is not ill-appearing.  HENT:     Head: Normocephalic and atraumatic.     Nose: Nose normal.  Eyes:     General: No scleral icterus.    Conjunctiva/sclera: Conjunctivae normal.  Cardiovascular:     Rate and Rhythm: Normal rate and regular rhythm.     Heart sounds: Normal heart sounds. No murmur heard.    No friction rub.  Pulmonary:     Effort: Pulmonary effort is normal. No respiratory distress.     Breath sounds: Normal breath sounds. No wheezing, rhonchi or rales.  Musculoskeletal:        General: Normal range of motion.     Comments: Shoulder Exam:  Palpation revealed no pain at the  AC joint or proximal biceps tendon. With hand behind back patient has pain to entire lateral portion of the shoulder Reduced active external ROM of R shoulder likely due to pain.  Rotator Cuff pathologies: Empty Can: pain without weakness Internal Rotation Lag Test: normal External rotation Lag Test: (elbow at side passive external rotation, unable to maintain position) Drop arm: cannot lift R arm > 90 deg Painful arc: Cannot lift arm > 90 deg  Impingement: Hawkins Test: positive     Skin:    General: Skin is warm and dry.     Coloration: Skin is not jaundiced or pale.  Neurological:     General: No focal deficit present.     Mental Status: She is alert.  Psychiatric:        Mood and Affect: Mood normal.         Behavior: Behavior normal.     No results found for any visits on 03/09/24.      Assessment & Plan:   Assessment and Plan    Right Shoulder Pain Severe right shoulder pain, suspect that this is likely due to either rotator cuff tendinopathy or bursitis, significantly interfering with work and daily activities. No weakness to empty can suggests lack of full tear. Physical examination reveals limited range of motion and pain on movement. Steroid injection considered for rapid relief, but not available today. Discussed the possibility of a steroid injection at [facility] for faster relief. X-ray recommended to assess shoulder anatomy before proceeding with injection. - Order right shoulder x-ray - Refer to [facility] for possible steroid injection - Alternating ibuprofen and acetaminophen  for pain management- continue OTC management - Advise rest for the shoulder as much as possible  Pneumonia Recent hospitalization for pneumonia. Currently experiencing residual dyspnea, especially when walking uphill or walking dogs. No chest pain reported, but experienced pressure and spasms under ribs during pneumonia. Lungs sound clear and oxygen saturation is good, indicating recovery is ongoing. Persistent dyspnea may be due to ongoing recovery from pneumonia.  General Health Maintenance Due for mammogram. Dense breast tissue, sister with breast cancer, and previous mammograms showing spots. Tomosynthesis recommended for better imaging of dense tissue. - Perform mammogram with tomosynthesis    No follow-ups on file.  Mikayla Hart T Mikayla Guajardo, PA-C

## 2024-03-12 ENCOUNTER — Ambulatory Visit (HOSPITAL_BASED_OUTPATIENT_CLINIC_OR_DEPARTMENT_OTHER): Admitting: Student

## 2024-03-12 ENCOUNTER — Ambulatory Visit (HOSPITAL_BASED_OUTPATIENT_CLINIC_OR_DEPARTMENT_OTHER): Payer: Self-pay | Admitting: Student

## 2024-03-14 ENCOUNTER — Ambulatory Visit (HOSPITAL_BASED_OUTPATIENT_CLINIC_OR_DEPARTMENT_OTHER): Payer: Self-pay | Admitting: Student

## 2024-03-19 ENCOUNTER — Ambulatory Visit (HOSPITAL_BASED_OUTPATIENT_CLINIC_OR_DEPARTMENT_OTHER)
Admission: RE | Admit: 2024-03-19 | Discharge: 2024-03-19 | Disposition: A | Discharge status: Home / Self Care | Source: Ambulatory Visit | Attending: Student | Admitting: Student

## 2024-03-19 ENCOUNTER — Ambulatory Visit (HOSPITAL_BASED_OUTPATIENT_CLINIC_OR_DEPARTMENT_OTHER): Payer: Self-pay | Admitting: Student

## 2024-03-19 ENCOUNTER — Ambulatory Visit (HOSPITAL_BASED_OUTPATIENT_CLINIC_OR_DEPARTMENT_OTHER): Admitting: Student

## 2024-03-19 ENCOUNTER — Encounter (HOSPITAL_BASED_OUTPATIENT_CLINIC_OR_DEPARTMENT_OTHER): Payer: Self-pay | Admitting: Student

## 2024-03-19 VITALS — BP 139/90 | HR 87 | Temp 98.5°F | Resp 16 | Ht 61.0 in | Wt 156.5 lb

## 2024-03-19 DIAGNOSIS — R61 Generalized hyperhidrosis: Secondary | ICD-10-CM | POA: Diagnosis not present

## 2024-03-19 DIAGNOSIS — Z91030 Bee allergy status: Secondary | ICD-10-CM | POA: Insufficient documentation

## 2024-03-19 DIAGNOSIS — J189 Pneumonia, unspecified organism: Secondary | ICD-10-CM | POA: Diagnosis not present

## 2024-03-19 DIAGNOSIS — R059 Cough, unspecified: Secondary | ICD-10-CM

## 2024-03-19 DIAGNOSIS — R0602 Shortness of breath: Secondary | ICD-10-CM

## 2024-03-19 DIAGNOSIS — I700 Atherosclerosis of aorta: Secondary | ICD-10-CM | POA: Insufficient documentation

## 2024-03-19 LAB — CBC WITH DIFFERENTIAL/PLATELET
Basophils Absolute: 0 x10E3/uL (ref 0.0–0.2)
Basos: 1 %
EOS (ABSOLUTE): 0.5 x10E3/uL — ABNORMAL HIGH (ref 0.0–0.4)
Eos: 6 %
Hematocrit: 39.4 % (ref 34.0–46.6)
Hemoglobin: 12.4 g/dL (ref 11.1–15.9)
Immature Grans (Abs): 0 x10E3/uL (ref 0.0–0.1)
Immature Granulocytes: 0 %
Lymphocytes Absolute: 1.8 x10E3/uL (ref 0.7–3.1)
Lymphs: 22 %
MCH: 23.9 pg — ABNORMAL LOW (ref 26.6–33.0)
MCHC: 31.5 g/dL (ref 31.5–35.7)
MCV: 76 fL — ABNORMAL LOW (ref 79–97)
Monocytes Absolute: 0.9 x10E3/uL (ref 0.1–0.9)
Monocytes: 10 %
Neutrophils Absolute: 5 x10E3/uL (ref 1.4–7.0)
Neutrophils: 60 %
Platelets: 407 x10E3/uL (ref 150–450)
RBC: 5.18 x10E6/uL (ref 3.77–5.28)
RDW: 13.7 % (ref 11.7–15.4)
WBC: 8.2 x10E3/uL (ref 3.4–10.8)

## 2024-03-19 LAB — BRAIN NATRIURETIC PEPTIDE: BNP: 11.3 pg/mL (ref 0.0–100.0)

## 2024-03-19 LAB — D-DIMER, QUANTITATIVE: D-DIMER: 3.85 mg{FEU}/L — ABNORMAL HIGH (ref 0.00–0.49)

## 2024-03-19 MED ORDER — EPINEPHRINE 0.3 MG/0.3ML IJ SOAJ: 0.3000 mg | INTRAMUSCULAR | 3 refills | Status: DC | PRN

## 2024-03-19 MED ORDER — LEVOFLOXACIN 750 MG PO TABS: 750.0000 mg | ORAL_TABLET | Freq: Every day | ORAL | 0 refills | Status: AC

## 2024-03-19 NOTE — Progress Notes (Signed)
 Called patient and discussed elevated d-dimer due to concurrent leg pain and shortness of breath. I suspect that this is likely elevated due to multilobar pneumonia that was seen on CXR (and is being treated with levofloxacin)- normal o2 sat, relatively unchanged EKG, and normal rate. In light of elevated D-dimer and clinical symptoms, I still recommended that this patient be seen at the hospital to r/o PE.

## 2024-03-19 NOTE — Patient Instructions (Addendum)
 It was nice to see you today!  - Please start a daily aspirin 81mg . - Make sure to get your chest xray before you leave.  - If your shortness of breath worsens at all or is accompanied by chest pain, you will need to go to the ER immediately. Other indications for the ER are as discussed. - I have sent in an epipen if you would like to pick it up.  If you have any problems before your next visit feel free to message me via MyChart (minor issues or questions) or call the office, otherwise you may reach out to schedule an office visit.  Thank you! Piers Baade, PA-C

## 2024-03-19 NOTE — Progress Notes (Signed)
 Established Patient Office Visit  Subjective   Patient ID: Mikayla Hart, female    DOB: Sep 21, 1961  Age: 63 y.o. MRN: [id number]  Chief Complaint  Patient presents with   Shortness of Breath    Still getting SOB when moving around. Took benadryl 50mg  every 4 hours. Has to put towel down and change them twice a night. Freezing, gets hot and then will take ibuprofen and tylenol . After that, gets to sweating.    Allergic Reaction    Got stung by 10 bees Saturday. Not sure if [facility] Flor wanted to order allergy testing to see if an epipen is needed. Normally gets swelling from bees/ yellow jackets.   Discussed the use of AI scribe software for clinical note transcription with the patient, who gave verbal consent to proceed.  History of Present Illness   Mikayla Hart is a 63 year old female who presents with persistent symptoms following a recent pneumonia diagnosis and multiple bee stings.  She was recently attacked by a swarm of ground-nesting bees, resulting in stings on her legs, arms, and breast. She did not seek emergency care but self-medicated with Vistaril and Benadryl, which she had on hand for her dog's allergies. 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  She has been experiencing persistent symptoms since a recent pneumonia diagnosis. She completed a five-day course of Augmentin  and prednisone but continues to feel unwell. She experiences significant fatigue, night sweats, and chills, which are somewhat alleviated by ibuprofen. She has shortness of breath, particularly with exertion, and uses an inhaler every four hours. She describes a cough producing light, frothy sputum and occasional chest pressure that feels like indigestion.  She reports leg pain in the back of her left thigh for the past two weeks, which worsens with walking. No significant swelling in her legs but notes pain that feels like a pulled muscle.  She experiences  periodic chest flutters that coincide with shortness of breath. No current chest pain but has experienced pressure in the center of her chest lasting five to six minutes, mostly at rest.  She has a history of smoking but has quit since her recent illness. She used to smoke once or twice a week but has stopped completely.  Her medication allergies include morphine, amlodipine , clonidine, tetracycline, and sulfa drugs. She is currently taking an inhaler every four hours and ibuprofen as needed for chills.      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   Chronic upper abdominal pain 10/03/2023   Screen for colon cancer 10/03/2023   Nausea and vomiting 10/03/2023   Microscopic hematuria 04/11/2023   S/P lumbar fusion 10/28/2020   Degenerative spondylolisthesis 08/13/2020   Hypokalemia 06/02/2020   Hypomagnesemia 06/02/2020   Non-intractable vomiting 06/02/2020   Essential hypertension 06/02/2020   Acute metabolic encephalopathy 06/02/2020   Acute encephalopathy 06/01/2020   Risk factors for obstructive sleep apnea 05/22/2020   Sjogren's disease (HCC) 05/22/2020   Gastroesophageal reflux disease without esophagitis 07/18/2019   Cervical pain 07/16/2019   Spinal stenosis of lumbar region 07/16/2019   Benign essential tremor 05/18/2019   Microcytosis 05/18/2019   Pulmonary hypertension (HCC) 05/18/2019   Situational mixed anxiety and depressive disorder 08/02/2018   Lumbar radiculopathy 01/10/2018   Vitamin D deficiency 02/27/2016  Past Medical History:  Diagnosis Date   Acid reflux    Acute encephalopathy 06/01/2020   Acute metabolic encephalopathy 06/02/2020   Benign essential tremor 05/18/2019   Cervical pain 07/16/2019   Chronic upper abdominal pain 10/03/2023    Degenerative spondylolisthesis 08/13/2020   Added automatically from request for surgery [id number]     Essential hypertension 06/02/2020   Gastroesophageal reflux disease without esophagitis 07/18/2019   Goiter 10/03/2023   Hypertension    Hypokalemia 06/02/2020   Hypomagnesemia 06/02/2020   Lumbar radiculopathy 01/10/2018   Microcytosis 05/18/2019   Microscopic hematuria 04/11/2023   Nausea and vomiting 10/03/2023   Non-intractable vomiting 06/02/2020   Osteoarthritis    Pulmonary hypertension (HCC) 05/18/2019   Mild, RVSP 29.4 mmHg 2018 TTE     Risk factors for obstructive sleep apnea 05/22/2020   DO I SNORE SCORE of 5, sleep referral declined (05/22/2020)     S/P lumbar fusion 10/28/2020   Screen for colon cancer 10/03/2023   Situational mixed anxiety and depressive disorder 08/02/2018   Sjogren's disease (HCC)    Spinal stenosis of lumbar region 07/16/2019   Vitamin D deficiency 02/27/2016   Social History   Tobacco Use   Smoking status: Never    Passive exposure: Never   Smokeless tobacco: Never   Tobacco comments:    Smokes tobacco w/ marijuana   Vaping Use   Vaping status: Never Used  Substance Use Topics   Alcohol use: Yes    Comment: rarely   Drug use: Yes    Types: Marijuana    Comment: as needed   Allergies  Allergen Reactions   Morphine Itching   Amlodipine  Cough   Clonidine Other (See Comments)    Other reaction(s): Other (See Comments)  Makes her sleepy and very drugged up  unknown  Unable to awake   Tramadol Nausea And Vomiting and Nausea Only   Bee Venom Swelling    knots   Sulfa Antibiotics Other (See Comments)    Eyedrops makes eyes swell   Tetracyclines & Related    Yellow Jacket Venom Swelling    Whole upper arm swelling       Review of Systems  Respiratory:  Positive for shortness of breath.    Per HPI.    Objective:     BP (!) 139/90   Pulse 87   Temp 98.5 F (36.9 C) (Oral)   Resp 16   Ht 5' 1 (1.549 m)   Wt 156 lb  8 oz (71 kg)   SpO2 96%   BMI 29.57 kg/m  BP Readings from Last 3 Encounters:  03/19/24 (!) 139/90  03/09/24 125/81  12/14/23 (!) 143/105   Wt Readings from Last 3 Encounters:  03/19/24 156 lb 8 oz (71 kg)  03/09/24 162 lb 12.8 oz (73.8 kg)  12/14/23 171 lb (77.6 kg)   SpO2 Readings from Last 3 Encounters:  03/19/24 96%  03/09/24 98%  12/14/23 100%      Physical Exam Vitals reviewed.  Constitutional:      General: She is not in acute distress.    Appearance: Normal appearance. She is not ill-appearing, toxic-appearing or diaphoretic.  HENT:     Head: Normocephalic and atraumatic.     Nose: Nose normal.   Eyes:     General: No scleral icterus.    Conjunctiva/sclera: Conjunctivae normal.    Cardiovascular:     Rate and Rhythm: Normal rate and regular rhythm.     Heart sounds: Normal heart sounds. No  murmur heard.    No friction rub.  Pulmonary:     Effort: Pulmonary effort is normal. No tachypnea, accessory muscle usage or respiratory distress.     Breath sounds: No stridor. Examination of the left-middle field reveals rales. Examination of the left-lower field reveals rales. Rales present. No wheezing or rhonchi.   Musculoskeletal:        General: Normal range of motion.     Right lower leg: No tenderness. No edema.     Left lower leg: No tenderness. No edema.   Skin:    General: Skin is warm and dry.     Coloration: Skin is not cyanotic, jaundiced or pale.     Findings: No erythema.   Neurological:     General: No focal deficit present.     Mental Status: She is alert.   Psychiatric:        Mood and Affect: Mood normal.        Behavior: Behavior normal.      No results found for any visits on 03/19/24.  Last CBC Lab Results  Component Value Date   WBC 8.2 03/19/2024   HGB 12.4 03/19/2024   HCT 39.4 03/19/2024   MCV 76 (L) 03/19/2024   MCH 23.9 (L) 03/19/2024   RDW 13.7 03/19/2024   PLT 407 03/19/2024   Last metabolic panel Lab Results   Component Value Date   GLUCOSE 91 10/03/2023   NA 142 10/03/2023   K 3.6 10/03/2023   CL 100 10/03/2023   CO2 28 10/03/2023   BUN 13 10/03/2023   CREATININE 0.81 10/03/2023   EGFR 82 10/03/2023   CALCIUM 10.0 10/03/2023   PROT 6.2 10/03/2023   ALBUMIN 4.1 10/03/2023   LABGLOB 2.1 10/03/2023   BILITOT 0.3 10/03/2023   ALKPHOS 100 10/03/2023   AST 25 10/03/2023   ALT 26 10/03/2023   ANIONGAP 17 (H) 11/06/2020   Last lipids No results found for: CHOL, HDL, LDLCALC, LDLDIRECT, TRIG, CHOLHDL Last hemoglobin A1c Lab Results  Component Value Date   HGBA1C 6.1 (H) 10/03/2023      The ASCVD Risk score (Arnett DK, et al., 2019) failed to calculate for the following reasons:   Cannot find a previous HDL lab   Cannot find a previous total cholesterol lab    Assessment & Plan:   Assessment and Plan    Pneumonia Persistent symptoms post-pneumonia, including cough, chills, and dyspnea. Rales of L lung suggest unresolved infection or inflammation. Previous treatment with Augmentin  and prednisone. Pleurisy considered as a factor in chest pain and dyspnea. Recovery from pneumonia may exceed six weeks.  - Order chest x-ray to assess lung and infection status. - Consider additional antibiotics based on x-ray results - Advise hospital visit if symptoms worsen UPDATE: - order levofloxacin to treat.  Shortness of breath and occasional chest pain Intermittent dyspnea and chest pain, especially at rest. Differential includes unresolved pneumonia, pleurisy, or cardiac issues. Previous EKGs showed no significant findings. Low suspicion of pulmonary embolism despite leg pain and dyspnea due to normal vitals. Hospital visit may be necessary if D-dimer is elevated. - Order EKG to assess cardiac function- normal and unchanged from baseline today. - Order D-dimer to rule out pulmonary embolism, may be elevated if pna is present- update: d dimer elevated and recommended going to the ER  to rule out PE. - Advise hospital visit if symptoms worsen or new symptoms develop - Initiate daily aspirin - Continue Pepcid  for chest discomfort, none present today.  Leg pain Pain in the posterior left thigh for two weeks, possibly due to muscle strain. Differential includes deep vein thrombosis due to concurrent dyspnea. D-dimer may be elevated due to inflammatory changes from pneumonia. - Order D-dimer to rule out deep vein thrombosis  Bee stings Multiple bee stings from ground-nesting bees caused a severe allergic reaction. Variable allergic reactions to bee stings, including hives and swelling, managed with Vistaril and Benadryl. No ER visit was made. - Educate on avoidance of bee exposure - Order epipen    Return if symptoms worsen or fail to improve.    Shonette Rhames T Cuauhtemoc Huegel, PA-C

## 2024-03-21 ENCOUNTER — Other Ambulatory Visit (HOSPITAL_BASED_OUTPATIENT_CLINIC_OR_DEPARTMENT_OTHER): Payer: Self-pay | Admitting: Student

## 2024-03-21 ENCOUNTER — Telehealth (HOSPITAL_BASED_OUTPATIENT_CLINIC_OR_DEPARTMENT_OTHER): Payer: Self-pay

## 2024-03-21 DIAGNOSIS — Z91030 Bee allergy status: Secondary | ICD-10-CM

## 2024-03-21 MED ORDER — EPINEPHRINE 0.3 MG/0.3ML IJ SOAJ: 0.3000 mg | INTRAMUSCULAR | 2 refills | Status: DC | PRN

## 2024-03-21 NOTE — Telephone Encounter (Signed)
 Insurance is declining to approve 2 pack for epipen. Please see alternatives.

## 2024-03-23 ENCOUNTER — Telehealth: Payer: Self-pay

## 2024-03-23 NOTE — Transitions of Care (Post Inpatient/ED Visit) (Signed)
 03/23/2024  Name: Mikayla Hart MRN: [id number] DOB: [birth year]  Today's TOC FU Call Status: Today's TOC FU Call Status:: Successful TOC FU Call Completed TOC FU Call Complete Date: 03/23/24 Patient's Name and Date of Birth confirmed.  Transition Care Management Follow-up Telephone Call Date of Discharge: 03/22/24 Discharge Facility: Other [facility]) Name of Other (Non[facility]) Discharge Facility: [facility] Type of Discharge: Inpatient Admission Primary Inpatient Discharge Diagnosis:: pneumonia How have you been since you were released from the hospital?: Better Any questions or concerns?: No  Items Reviewed: Did you receive and understand the discharge instructions provided?: Yes Medications obtained,verified, and reconciled?: Yes (Medications Reviewed) Any new allergies since your discharge?: No Dietary orders reviewed?: Yes Do you have support at home?: Yes People in Home [RPT]: spouse  Medications Reviewed Today: Medications Reviewed Today     Reviewed by Darrall Ellison, LPN (Licensed Practical Nurse) on 03/23/24 at [phone]  Med List Status: <None>   Medication Order Taking? Sig Documenting Provider Last Dose Status Informant  acetaminophen  (TYLENOL ) 650 MG suppository [id number] Yes Place 650 mg rectally every 6 (six) hours as needed. [provider]  Active   amoxicillin -clavulanate (AUGMENTIN ) 875-125 MG tablet [id number] Yes Take 1 tablet by mouth 2 (two) times daily. [provider]  Active   celecoxib (CELEBREX) 200 MG capsule [id number] Yes Take 200 mg by mouth 2 (two) times daily. [provider]  Active   chlorthalidone  (HYGROTON ) 50 MG tablet [id number] Yes Take 50 mg by mouth daily. [provider]  Active   Cholecalciferol 50 MCG (2000 UT) TABS [id number] Yes Take 2,000 Units by mouth daily.  [provider]  Active Self  EPINEPHrine 0.3 mg/0.3 mL IJ SOAJ injection [id number] Yes Inject 0.3 mg into the muscle as needed  for anaphylaxis. Rothfuss, Jacob T, PA-C  Active   famotidine  (PEPCID ) 20 MG tablet [id number] Yes Take 1 tablet (20 mg total) by mouth 2 (two) times daily. Kennedy-Smith, Colleen M, NP  Active   gabapentin  (NEURONTIN ) 300 MG capsule [id number] Yes Take 300 mg by mouth 3 (three) times daily. [provider]  Active   ibuprofen (ADVIL) 600 MG tablet [id number] Yes Take 600 mg by mouth every 6 (six) hours as needed (as needed). [provider]  Active   levofloxacin (LEVAQUIN) 750 MG tablet [id number] Yes Take 1 tablet (750 mg total) by mouth daily for 5 days. Rothfuss, Jacob T, PA-C  Active   losartan  (COZAAR ) 100 MG tablet [id number] Yes Take 100 mg by mouth daily.  [provider]  Active Self  ondansetron  (ZOFRAN  ODT) 8 MG disintegrating tablet [id number] Yes Take 1 tablet (8 mg total) by mouth every 8 (eight) hours as needed for nausea or vomiting. Molpus, John, MD  Active   Potassium Chloride  ER 20 MEQ TBCR [id number] Yes Take 40 mEq by mouth daily.  [provider]  Active Self  promethazine (PHENERGAN) 12.5 MG suppository [id number] Yes Place 12.5 mg rectally as needed. [provider]  Active             Home Care and Equipment/Supplies: Were Home Health Services Ordered?: NA Any new equipment or medical supplies ordered?: NA  Functional Questionnaire: Do you need assistance with bathing/showering or dressing?: No Do you need assistance with meal preparation?: No Do you need assistance with eating?: No Do you have difficulty maintaining continence: No Do you need assistance with getting out of bed/getting out of a chair/moving?: No Do you have difficulty managing or taking your medications?: No  Follow up appointments reviewed: Specialist Hospital Follow-up appointment confirmed?: NA Do you need transportation to your follow-up appointment?: No Do you understand care options if your condition(s) worsen?: Yes-patient verbalized  understanding    SIGNATURE Darrall Ellison, LPN [facility] Nurse Health Advisor Direct Dial [phone]

## 2024-03-28 ENCOUNTER — Other Ambulatory Visit (HOSPITAL_BASED_OUTPATIENT_CLINIC_OR_DEPARTMENT_OTHER): Payer: Self-pay | Admitting: Student

## 2024-03-28 DIAGNOSIS — Z91030 Bee allergy status: Secondary | ICD-10-CM

## 2024-03-28 MED ORDER — AUVI-Q 0.3 MG/0.3ML IJ SOAJ
0.3000 mg | INTRAMUSCULAR | 3 refills | Status: AC | PRN
Start: 2024-03-28 — End: ?

## 2024-03-29 ENCOUNTER — Ambulatory Visit (HOSPITAL_BASED_OUTPATIENT_CLINIC_OR_DEPARTMENT_OTHER): Admitting: Student

## 2024-03-29 ENCOUNTER — Encounter (HOSPITAL_BASED_OUTPATIENT_CLINIC_OR_DEPARTMENT_OTHER): Payer: Self-pay | Admitting: Student

## 2024-03-29 VITALS — BP 133/93 | HR 86 | Temp 98.2°F | Resp 16 | Ht 61.0 in | Wt 156.4 lb

## 2024-03-29 DIAGNOSIS — J189 Pneumonia, unspecified organism: Secondary | ICD-10-CM

## 2024-03-29 DIAGNOSIS — Z09 Encounter for follow-up examination after completed treatment for conditions other than malignant neoplasm: Secondary | ICD-10-CM

## 2024-03-29 MED ORDER — ALBUTEROL SULFATE HFA 108 (90 BASE) MCG/ACT IN AERS: 2.0000 | INHALATION_SPRAY | Freq: Four times a day (QID) | RESPIRATORY_TRACT | 0 refills | Status: AC | PRN

## 2024-03-29 NOTE — Progress Notes (Signed)
 Established Patient Office Visit  Subjective   Patient ID: Mikayla Hart, female    DOB: 17-Nov-1960  Age: 63 y.o. MRN: [id number]  Chief Complaint  Patient presents with   Hospitalization Follow-up    Here for hospital follow up. Waking up with sheets soaked for days now.     HPI  Discussed the use of AI scribe software for clinical note transcription with the patient, who gave verbal consent to proceed.  History of Present Illness   Mikayla Hart is a 63 year old female with pneumonia and fibromyalgia who presents for a follow-up after hospitalization.  She was recently hospitalized for pneumonia and received two doses of IV Zosyn. She describes the pneumonia as 'nasty' and was also given Duoneb treatments, which highlighted her breathing difficulties. She is currently on Augmentin , with her last dose scheduled for tonight. She experiences significant shortness of breath, impacting her ability to perform daily activities, such as needing assistance from her husband to walk back from the mailbox, a distance of about fifty feet. She reports that breathing has much improved since IV abx at the hospital.  She experiences night sweats, waking up with soaked bedding and clothing, which she describes as being able to 'literally wring them out.' This has been ongoing for years, occurring at least five nights a week. Despite having the air conditioning set at 75 degrees, she feels cold during the day and experiences chills frequently. Her last TSH was reported as normal. Setting up with GI for colonoscopy. Normal mammogram this year. Will need to update pap smear. No RF known for TB.  She mentions a history of leg pain alongside her shortness of breath, which led to a D-dimer test (which was elevated likely due to pna), this was again elevated in the hospital and worked up. She expresses frustration with the medical staff's handling of her case during recent ER visit but is thankful for the  care.  She reports feeling better after receiving the IV antibiotics and Duoneb treatments.  She has been to the ER multiple times in the past year, including visits in January and March, due to not feeling well. She has concerns of environmental factors in her workplace as a contributing factor to her recurrent illnesses. She describes the building as old, with water issues and potential mold presence.  She is currently not on any inhaler as her previous one was destroyed by her dog. She reports having difficulty with physical activity due to her breathing issues, which have been exacerbated by her recent illness.      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   Chronic upper abdominal pain 10/03/2023   Screen for colon cancer 10/03/2023   Nausea and vomiting 10/03/2023   Microscopic hematuria 04/11/2023   S/P lumbar fusion 10/28/2020   Degenerative spondylolisthesis 08/13/2020   Hypokalemia 06/02/2020   Hypomagnesemia 06/02/2020   Non-intractable vomiting 06/02/2020   Essential hypertension 06/02/2020   Acute metabolic encephalopathy 06/02/2020   Acute encephalopathy 06/01/2020   Risk factors for obstructive sleep apnea 05/22/2020   Sjogren's disease (HCC) 05/22/2020   Gastroesophageal reflux disease without esophagitis 07/18/2019   Spinal stenosis of lumbar region 07/16/2019   Benign essential tremor 05/18/2019   Microcytosis 05/18/2019   Pulmonary hypertension (HCC) 05/18/2019   Situational mixed anxiety and  depressive disorder 08/02/2018   Lumbar radiculopathy 01/10/2018   Vitamin D deficiency 02/27/2016   Past Medical History:  Diagnosis Date   Acid reflux    Acute encephalopathy 06/01/2020   Acute metabolic encephalopathy 06/02/2020   Benign essential  tremor 05/18/2019   Cervical pain 07/16/2019   Chronic upper abdominal pain 10/03/2023   Degenerative spondylolisthesis 08/13/2020   Added automatically from request for surgery [id number]     Essential hypertension 06/02/2020   Gastroesophageal reflux disease without esophagitis 07/18/2019   Goiter 10/03/2023   Hypertension    Hypokalemia 06/02/2020   Hypomagnesemia 06/02/2020   Lumbar radiculopathy 01/10/2018   Microcytosis 05/18/2019   Microscopic hematuria 04/11/2023   Nausea and vomiting 10/03/2023   Non-intractable vomiting 06/02/2020   Osteoarthritis    Pulmonary hypertension (HCC) 05/18/2019   Mild, RVSP 29.4 mmHg 2018 TTE     Risk factors for obstructive sleep apnea 05/22/2020   DO I SNORE SCORE of 5, sleep referral declined (05/22/2020)     S/P lumbar fusion 10/28/2020   Screen for colon cancer 10/03/2023   Situational mixed anxiety and depressive disorder 08/02/2018   Sjogren's disease (HCC)    Spinal stenosis of lumbar region 07/16/2019   Vitamin D deficiency 02/27/2016   Social History   Tobacco Use   Smoking status: Never    Passive exposure: Never   Smokeless tobacco: Never   Tobacco comments:    Smokes tobacco w/ marijuana   Vaping Use   Vaping status: Never Used  Substance Use Topics   Alcohol use: Yes    Comment: rarely   Drug use: Yes    Types: Marijuana    Comment: as needed   Allergies  Allergen Reactions   Morphine Itching   Amlodipine  Cough   Clonidine Other (See Comments)    Other reaction(s): Other (See Comments)  Makes her sleepy and very drugged up  unknown  Unable to awake   Tramadol Nausea And Vomiting and Nausea Only   Bee Venom Swelling    knots   Sulfa Antibiotics Other (See Comments)    Eyedrops makes eyes swell   Tetracyclines & Related    Yellow Jacket Venom Swelling    Whole upper arm swelling       ROS Per HPI.    Objective:     BP (!) 133/93   Pulse 86   Temp 98.2 F (36.8 C) (Oral)   Resp 16   Ht 5'  1 (1.549 m)   Wt 156 lb 6.4 oz (70.9 kg)   SpO2 97%   BMI 29.55 kg/m  BP Readings from Last 3 Encounters:  03/29/24 (!) 133/93  03/19/24 (!) 139/90  03/09/24 125/81   Wt Readings from Last 3 Encounters:  03/29/24 156 lb 6.4 oz (70.9 kg)  03/19/24 156 lb 8 oz (71 kg)  03/09/24 162 lb 12.8 oz (73.8 kg)      Physical Exam Vitals reviewed.  Constitutional:      General: She is not in acute distress.    Appearance: Normal appearance. She is not ill-appearing.  HENT:     Head: Normocephalic and atraumatic.     Nose: Nose normal.   Eyes:     General: No scleral icterus.    Conjunctiva/sclera: Conjunctivae normal.    Cardiovascular:     Rate and Rhythm: Normal rate and regular rhythm.     Heart sounds: Normal heart sounds. No murmur heard.    No friction rub.  Pulmonary:     Effort:  Pulmonary effort is normal. No respiratory distress.     Breath sounds: No wheezing.     Comments: Minor crackles in Left lower lobe  Musculoskeletal:        General: Normal range of motion.   Skin:    General: Skin is warm and dry.     Coloration: Skin is not jaundiced or pale.   Neurological:     General: No focal deficit present.     Mental Status: She is alert.   Psychiatric:        Mood and Affect: Mood normal.        Behavior: Behavior normal.      No results found for any visits on 03/29/24.  Last CBC Lab Results  Component Value Date   WBC 8.2 03/19/2024   HGB 12.4 03/19/2024   HCT 39.4 03/19/2024   MCV 76 (L) 03/19/2024   MCH 23.9 (L) 03/19/2024   RDW 13.7 03/19/2024   PLT 407 03/19/2024   Last metabolic panel Lab Results  Component Value Date   GLUCOSE 91 10/03/2023   NA 142 10/03/2023   K 3.6 10/03/2023   CL 100 10/03/2023   CO2 28 10/03/2023   BUN 13 10/03/2023   CREATININE 0.81 10/03/2023   EGFR 82 10/03/2023   CALCIUM 10.0 10/03/2023   PROT 6.2 10/03/2023   ALBUMIN 4.1 10/03/2023   LABGLOB 2.1 10/03/2023   BILITOT 0.3 10/03/2023   ALKPHOS 100  10/03/2023   AST 25 10/03/2023   ALT 26 10/03/2023   ANIONGAP 17 (H) 11/06/2020   Last lipids No results found for: CHOL, HDL, LDLCALC, LDLDIRECT, TRIG, CHOLHDL Last hemoglobin A1c Lab Results  Component Value Date   HGBA1C 6.1 (H) 10/03/2023      The ASCVD Risk score (Arnett DK, et al., 2019) failed to calculate for the following reasons:   Cannot find a previous HDL lab   Cannot find a previous total cholesterol lab    Assessment & Plan:   Assessment and Plan    Hospital followup/ Pneumonia Recent hospitalization for pneumonia treated with IV Zosyn and oral Augmentin . Symptoms included dyspnea and leg pain. Improvement noted post-antibiotic treatment, but crackles persist, indicating ongoing recovery. Aspiration pneumonia was considered. Significant improvement reported, but exertional dyspnea persists, such as difficulty walking to the mailbox. Albuterol  inhaler prescribed for trial of symptomatic relief, though efficacy is uncertain. - Prescribe albuterol  inhaler for symptomatic relief of dyspnea. - Monitor lung sounds and oxygen levels in follow-up visit in 3-6 weeks. - Provide work excuse note for today and tomorrow.  Night Sweats Severe night sweats for years, with episodes of waking up with soaked bedding and clothing. No clear etiology identified; TSH normal. Will need to update the rest of her cancer screenings. Setting up with GI for colonoscopy. Normal mammogram this year. Will need to update pap smear. No RF known for TB.  General Health Maintenance Routine health screenings discussed. Mammogram results normal. Colonoscopy referral made, but she has not been contacted yet. - Provide contact number for colonoscopy scheduling. - Ensure follow-up on colonoscopy appointment.   I personally spent a total of 45 minutes in the care of the patient today including preparing to see the patient, getting/reviewing separately obtained history, performing a medically  appropriate exam/evaluation, placing orders, and documenting clinical information in the EHR.     Return in about 6 weeks (around 05/10/2024) for lung check.    Lakishia Bourassa T Kennetta Pavlovic, PA-C

## 2024-03-29 NOTE — Patient Instructions (Signed)
 It was nice to see you today!  As we discussed in clinic:  Come back back to see me if you need me sooner.  If you have any problems before your next visit feel free to message me via MyChart (minor issues or questions) or call the office, otherwise you may reach out to schedule an office visit.  Thank you! Kempton Milne, PA-C

## 2024-04-03 ENCOUNTER — Other Ambulatory Visit (HOSPITAL_BASED_OUTPATIENT_CLINIC_OR_DEPARTMENT_OTHER): Payer: Self-pay

## 2024-04-03 MED ORDER — CHLORTHALIDONE 50 MG PO TABS: 50.0000 mg | ORAL_TABLET | Freq: Every day | ORAL | 1 refills | Status: DC

## 2024-04-03 NOTE — Progress Notes (Signed)
[facility] requested refill  

## 2024-04-12 ENCOUNTER — Ambulatory Visit (HOSPITAL_BASED_OUTPATIENT_CLINIC_OR_DEPARTMENT_OTHER): Admitting: Student

## 2024-04-12 ENCOUNTER — Encounter (HOSPITAL_BASED_OUTPATIENT_CLINIC_OR_DEPARTMENT_OTHER): Payer: Self-pay | Admitting: Student

## 2024-04-12 ENCOUNTER — Ambulatory Visit: Payer: Self-pay

## 2024-04-12 ENCOUNTER — Encounter (HOSPITAL_BASED_OUTPATIENT_CLINIC_OR_DEPARTMENT_OTHER): Payer: Self-pay

## 2024-04-12 VITALS — BP 134/91 | HR 83 | Temp 98.5°F | Resp 16 | Ht 61.0 in | Wt 155.6 lb

## 2024-04-12 DIAGNOSIS — R0602 Shortness of breath: Secondary | ICD-10-CM

## 2024-04-12 DIAGNOSIS — K219 Gastro-esophageal reflux disease without esophagitis: Secondary | ICD-10-CM

## 2024-04-12 MED ORDER — FAMOTIDINE 20 MG PO TABS: 20.0000 mg | ORAL_TABLET | Freq: Two times a day (BID) | ORAL | 1 refills | Status: AC

## 2024-04-12 MED ORDER — PROMETHAZINE-DM 6.25-15 MG/5ML PO SYRP: 5.0000 mL | ORAL_SOLUTION | Freq: Four times a day (QID) | ORAL | 0 refills | Status: DC | PRN

## 2024-04-12 MED ORDER — IPRATROPIUM-ALBUTEROL 0.5-2.5 (3) MG/3ML IN SOLN
3.0000 mL | Freq: Once | RESPIRATORY_TRACT | Status: AC
  Administered 2024-04-12: 3 mL via RESPIRATORY_TRACT

## 2024-04-12 NOTE — Telephone Encounter (Signed)
 FYI Only or Action Required?: FYI only for provider.  Patient was last seen in primary care on 03/29/24.  Called Nurse Triage reporting Shortness of Breath/cough.  Symptoms began several weeks ago.  Interventions attempted: OTC medications: Nyquil/Albuterol  inhlaer.  Symptoms are: gradually worsening.  Triage Disposition: OV today with PCP Patient/caregiver understands and will follow disposition?: yes     Copied from CRM [id number]. Topic: Clinical - Red Word Triage >> Apr 12, 2024  8:25 AM [place] MATSU wrote: Red Word that prompted transfer to Nurse Triage:  shortness of breath Reason for Disposition  [1] MODERATE difficulty breathing (e.g., speaks in phrases, SOB even at rest, pulse 100-120) AND [2] NEW-onset or WORSE than normal  Answer Assessment - Initial Assessment Questions 1. RESPIRATORY STATUS: Describe your breathing? (e.g., wheezing, shortness of breath, unable to speak, severe coughing)      SOB- cannot take a deep breath and with exertion/coughing fit at night 2. ONSET: When did this breathing problem begin?      1 week after  3. PATTERN Does the difficult breathing come and go, or has it been constant since it started?      Comes and goes but always there varies in intensity 4. SEVERITY: How bad is your breathing? (e.g., mild, moderate, severe)      Mild moderate  6. CARDIAC HISTORY: Do you have any history of heart disease? (e.g., heart attack, angina, bypass surgery, angioplasty)      palpitations 7. LUNG HISTORY: Do you have any history of lung disease?  (e.g., pulmonary embolus, asthma, emphysema)  8. CAUSE: What do you think is causing the breathing problem?      URI 9. OTHER SYMPTOMS: Do you have any other symptoms? (e.g., chest pain, cough, dizziness, fever, runny nose)     cough  Protocols used: Breathing Difficulty-A-AH

## 2024-04-12 NOTE — Progress Notes (Signed)
 Acute Office Visit  Subjective:     Patient ID: Mikayla Hart, female    DOB: [birth year], 63 y.o.   MRN: [id number]  Chief Complaint  Patient presents with   Shortness of Breath    SOB, bad, since Friday 04/06/2024. Ribs hurt. Has no energy. Did get better for a little while but feeling bad again. Has old rx of Toradol that she took. Nurse at work checked lungs. Said lower left lung sounded crackling. Would like to see pulmonologist. Was told lymph nodes needed to be rechecked during 1st PNA dx.    Medication Refill    Refill needed for pepcid .     HPI  Discussed the use of AI scribe software for clinical note transcription with the patient, who gave verbal consent to proceed.  History of Present Illness   The patient, with a history of recurrent pneumonia, presents with shortness of breath and fatigue.  The patient experiences shortness of breath, particularly during ambulation, and describes an episode where she had to stop and rest after walking a short distance to her workplace. Despite the minimal incline, she felt significantly breathless. She also reports fatigue impacting her ability to work.  She has been experiencing nocturnal coughing fits, which occur after lying down. She has taken Nyquil for two consecutive nights to manage these symptoms. No recent exposure to anyone sick is noted, and she expresses concern about her symptoms.  She has a history of recurrent pneumonia and mentions previous treatment with Levaquin , which was discontinued during a hospital visit. She reports that when she left the hospital, she was told she still had crackles in the same place, and she is unsure if this has ever cleared up.  She uses albuterol  frequently, describing it as 'like candy,' needing it five to six times, especially after being in the heat. She reports chest tightness and a sensation of pulling inside her chest, necessitating the use of the inhaler.  She has a history of left  ventricular hypertrophy noted in 2022 and has had previous echocardiograms and EKGs, which were reported as fine except during a hospital visit. Enlarged lymph nodes were noted in a previous scan, with a recommendation for follow-up in three months, which has not yet occurred.  She is currently taking Pepcid  20 mg twice daily for GERD, which effectively manages her symptoms. She has a history of indigestion but has not experienced significant regurgitation or reflux in recent years. She previously tried Protonix  but experienced adverse symptoms, leading to a switch to Pepcid .  She is also taking chlorthalidone , initially at 25 mg, but now taking a single 50 mg pill. She has been trying B complex vitamins from the [employer] to address her lack of energy, although her oxygen saturation levels have been consistently good, even during past pneumonia episodes.       ROS Per HPI     Objective:    BP (!) 134/91   Pulse 83   Temp 98.5 F (36.9 C) (Oral)   Resp 16   Ht 5' 1 (1.549 m)   Wt 155 lb 9.6 oz (70.6 kg)   SpO2 96%   BMI 29.40 kg/m    Physical Exam Vitals reviewed.  Constitutional:      General: She is not in acute distress.    Appearance: Normal appearance. She is not ill-appearing or toxic-appearing.  HENT:     Head: Normocephalic and atraumatic.     Nose: Nose normal.  Eyes:  General: No scleral icterus.    Conjunctiva/sclera: Conjunctivae normal.  Cardiovascular:     Rate and Rhythm: Normal rate and regular rhythm.     Heart sounds: Normal heart sounds. No murmur heard.    No friction rub.  Pulmonary:     Effort: Pulmonary effort is normal. No respiratory distress.     Breath sounds: Examination of the left-lower field reveals rales. Rales present. No wheezing or rhonchi.  Musculoskeletal:        General: Normal range of motion.  Skin:    General: Skin is warm and dry.     Coloration: Skin is not jaundiced or pale.  Neurological:     General: No focal  deficit present.     Mental Status: She is alert.  Psychiatric:        Mood and Affect: Mood normal.        Behavior: Behavior normal.     No results found for any visits on 04/12/24.      Assessment & Plan:    Assessment and Plan    Shortness of breath Continued exacerbation.  Reports of dyspnea, particularly during ambulation, and fatigue. Concern for COPD or recurrent pneumonia due to recurrent pulmonary issues.  Frequent albuterol  use with limited relief, especially in heat. Considering COPD exacerbation or another bout of pneumonia. Pulmonology referral for further evaluation and likely PFTs. DuoNeb trial in-office to assess symptom improvement-moderate improvement symptomatically.  Of note-patient does state that she has a family history of sarcoidosis. - Administer DuoNeb in-office to assess improvement in symptoms - Refer to pulmonology for further evaluation and pulmonary function tests - Consider stronger inhaler pending pulmonology evaluation - Provide Promethazine  DM syrup for symptoms at night.  Refill Pepcid .  Gastroesophageal reflux disease (GERD) Currently taking Pepcid  20 mg twice daily with no issues reported.  Discussed relation of GERD and pneumonia.  Previous symptoms with Protonix  led to switch to Pepcid . Nighttime cough could indicate reflux. - Continue Pepcid  20 mg twice daily  Hypertension Recent change from two 25 mg pills to one 50 mg pill of chlorthalidone  for hypertension management, which is currently being taken. - Continue chlorthalidone  50 mg daily      Return if symptoms worsen or fail to improve.  Delaynie Stetzer T Yilia Sacca, PA-C

## 2024-04-12 NOTE — Patient Instructions (Signed)
 It was nice to see you today!  If you have any problems before your next visit feel free to message me via MyChart (minor issues or questions) or call the office, otherwise you may reach out to schedule an office visit.  Thank you! Pau Banh, PA-C

## 2024-04-18 ENCOUNTER — Telehealth (HOSPITAL_BASED_OUTPATIENT_CLINIC_OR_DEPARTMENT_OTHER): Payer: Self-pay

## 2024-04-18 ENCOUNTER — Other Ambulatory Visit (HOSPITAL_BASED_OUTPATIENT_CLINIC_OR_DEPARTMENT_OTHER): Payer: Self-pay | Admitting: Student

## 2024-04-18 DIAGNOSIS — R0602 Shortness of breath: Secondary | ICD-10-CM

## 2024-04-18 MED ORDER — BUDESONIDE-FORMOTEROL FUMARATE 160-4.5 MCG/ACT IN AERO: 2.0000 | INHALATION_SPRAY | Freq: Two times a day (BID) | RESPIRATORY_TRACT | 3 refills | Status: AC

## 2024-04-18 NOTE — Telephone Encounter (Signed)
 Source  Mikayla Hart (Patient)   Subject  Mikayla Hart (Patient)   Topic  Clinical - Medication Question    Communication  Reason for CRM: Patient Is calling to speak with the nurse she was in the office last week and spoke to [place] about changing her inhaler, sh would like to proceed with the change.    Patient would like to be called at [phone] ext [phone]

## 2024-04-19 ENCOUNTER — Telehealth (HOSPITAL_BASED_OUTPATIENT_CLINIC_OR_DEPARTMENT_OTHER): Payer: Self-pay

## 2024-04-19 NOTE — Telephone Encounter (Signed)
 Pt advised and voices understanding.  Pt said she picked it up already. Did not pay anything for it. Already feels better. FYI to PCP.

## 2024-05-10 ENCOUNTER — Ambulatory Visit (HOSPITAL_BASED_OUTPATIENT_CLINIC_OR_DEPARTMENT_OTHER): Admitting: Student

## 2024-05-10 ENCOUNTER — Encounter (HOSPITAL_BASED_OUTPATIENT_CLINIC_OR_DEPARTMENT_OTHER): Payer: Self-pay | Admitting: Student

## 2024-05-10 VITALS — BP 142/92 | HR 81 | Temp 98.5°F | Resp 16 | Ht 61.0 in | Wt 156.6 lb

## 2024-05-10 DIAGNOSIS — R053 Chronic cough: Secondary | ICD-10-CM

## 2024-05-10 DIAGNOSIS — I10 Essential (primary) hypertension: Secondary | ICD-10-CM | POA: Diagnosis not present

## 2024-05-10 DIAGNOSIS — K219 Gastro-esophageal reflux disease without esophagitis: Secondary | ICD-10-CM | POA: Diagnosis not present

## 2024-05-10 DIAGNOSIS — B949 Sequelae of unspecified infectious and parasitic disease: Secondary | ICD-10-CM

## 2024-05-10 DIAGNOSIS — R5383 Other fatigue: Secondary | ICD-10-CM

## 2024-05-10 MED ORDER — BENZONATATE 100 MG PO CAPS: 100.0000 mg | ORAL_CAPSULE | Freq: Two times a day (BID) | ORAL | 0 refills | Status: DC | PRN

## 2024-05-10 NOTE — Patient Instructions (Signed)
 It was nice to see you today!  If you have any problems before your next visit feel free to message me via MyChart (minor issues or questions) or call the office, otherwise you may reach out to schedule an office visit.  Thank you! Pau Banh, PA-C

## 2024-05-10 NOTE — Progress Notes (Signed)
 Established Patient Office Visit  Subjective   Patient ID: Mikayla Hart, female    DOB: Jun 19, 1961  Age: 63 y.o. MRN: [id number]  Chief Complaint  Patient presents with   Medical Management of Chronic Issues    Follow up. Budesonide -formoterol  gave her a lot of energy. Cleaned the whole house same day.     HPI  Discussed the use of AI scribe software for clinical note transcription with the patient, who gave verbal consent to proceed.  History of Present Illness   Mikayla Hart is a 63 year old female who presents for a follow-up visit to monitor her lung condition and medication use.  She has been using an inhaler twice a day as directed but experienced manic-like symptoms, including insomnia and excessive energy, leading her to clean her entire house overnight. Consequently, she reduced the inhaler use to once a day or as needed, which has helped her breathe better. Overall, much improved. Should be following up with pulm in around a month.  She has been experiencing leg pain, initially in one leg and now in both, which she attributes to the piriformis muscle. She started exercises three days ago, which have improved her symptoms. The pain is constant, and she does not have many stairs to test if it worsens with activity. She also experienced a severe pain from her hip to her knee after hitting a bone while driving, which resolved after resting.  She has been coughing sporadically and uses dextromethorphan-based medication to manage it. She had a severe coughing spell at a restaurant, which required her to use her inhaler- she reports this helped. She takes Pepcid , usually once a day at night, and has been trying to increase her water intake.  She reports a recent weight gain after being down to 152 pounds due to illness. She attributes the weight gain to eating more protein, specifically hamburgers without bread, to address muscle deconditioning from not eating well during her  illness.  She works in an environment with frequent plumbing issues, including backed-up toilets and leaking ceilings, which she believes contributed to her previous illness. She plans to retire from this job in a year and nine months but intends to continue working elsewhere.      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   Chronic upper abdominal pain 10/03/2023   Nausea and vomiting 10/03/2023   Microscopic hematuria 04/11/2023   S/P lumbar fusion 10/28/2020   Degenerative spondylolisthesis 08/13/2020   Non-intractable vomiting 06/02/2020   Essential hypertension 06/02/2020   Risk factors for obstructive sleep apnea 05/22/2020   Sjogren's disease (HCC) 05/22/2020   Gastroesophageal reflux disease without esophagitis 07/18/2019   Spinal stenosis of lumbar region 07/16/2019   Benign essential tremor 05/18/2019   Microcytosis 05/18/2019   Pulmonary hypertension (HCC) 05/18/2019   Situational mixed anxiety and depressive disorder 08/02/2018   Lumbar radiculopathy 01/10/2018   Vitamin D deficiency 02/27/2016   Past Medical History:  Diagnosis Date   Acid reflux    Acute encephalopathy 06/01/2020   Acute metabolic encephalopathy 06/02/2020   Benign essential tremor 05/18/2019   Cervical pain 07/16/2019   Chronic upper abdominal pain 10/03/2023   Degenerative spondylolisthesis 08/13/2020   Added automatically from request for surgery [id number]     Essential hypertension 06/02/2020   Gastroesophageal reflux disease  without esophagitis 07/18/2019   Goiter 10/03/2023   Hypertension    Hypokalemia 06/02/2020   Hypomagnesemia 06/02/2020   Lumbar radiculopathy 01/10/2018   Microcytosis 05/18/2019   Microscopic hematuria 04/11/2023   Nausea and vomiting 10/03/2023    Non-intractable vomiting 06/02/2020   Osteoarthritis    Pulmonary hypertension (HCC) 05/18/2019   Mild, RVSP 29.4 mmHg 2018 TTE     Risk factors for obstructive sleep apnea 05/22/2020   DO I SNORE SCORE of 5, sleep referral declined (05/22/2020)     S/P lumbar fusion 10/28/2020   Screen for colon cancer 10/03/2023   Situational mixed anxiety and depressive disorder 08/02/2018   Sjogren's disease (HCC)    Spinal stenosis of lumbar region 07/16/2019   Vitamin D deficiency 02/27/2016   Social History   Tobacco Use   Smoking status: Never    Passive exposure: Never   Smokeless tobacco: Never   Tobacco comments:    Smokes tobacco w/ marijuana   Vaping Use   Vaping status: Never Used  Substance Use Topics   Alcohol use: Yes    Comment: rarely   Drug use: Yes    Types: Marijuana    Comment: as needed   Allergies  Allergen Reactions   Morphine Itching   Amlodipine  Cough   Clonidine Other (See Comments)    Other reaction(s): Other (See Comments)  Makes her sleepy and very drugged up  unknown  Unable to awake   Tramadol Nausea And Vomiting and Nausea Only   Bee Venom Swelling    knots   Sulfa Antibiotics Other (See Comments)    Eyedrops makes eyes swell   Tetracyclines & Related    Yellow Jacket Venom Swelling    Whole upper arm swelling       ROS Per HPI.    Objective:     BP (!) 142/92   Pulse 81   Temp 98.5 F (36.9 C) (Oral)   Resp 16   Ht 5' 1 (1.549 m)   Wt 156 lb 9.6 oz (71 kg)   SpO2 96%   BMI 29.59 kg/m  BP Readings from Last 3 Encounters:  05/10/24 (!) 142/92  04/12/24 (!) 134/91  03/29/24 (!) 133/93   Wt Readings from Last 3 Encounters:  05/10/24 156 lb 9.6 oz (71 kg)  04/12/24 155 lb 9.6 oz (70.6 kg)  03/29/24 156 lb 6.4 oz (70.9 kg)      Physical Exam Vitals reviewed.  Constitutional:      General: She is not in acute distress.    Appearance: Normal appearance. She is not ill-appearing.  HENT:     Head: Normocephalic and  atraumatic.     Nose: Nose normal.  Eyes:     General: No scleral icterus.    Conjunctiva/sclera: Conjunctivae normal.  Cardiovascular:     Rate and Rhythm: Normal rate and regular rhythm.     Heart sounds: Normal heart sounds. No murmur heard.    No friction rub.  Pulmonary:     Effort: Pulmonary effort is normal. No respiratory distress.     Breath sounds: Normal breath sounds. No wheezing, rhonchi or rales.  Musculoskeletal:        General: Normal range of motion.  Skin:    General: Skin is warm and dry.     Coloration: Skin is not jaundiced or pale.  Neurological:     General: No focal deficit present.     Mental Status: She is alert.  Psychiatric:  Mood and Affect: Mood normal.        Behavior: Behavior normal.      No results found for any visits on 05/10/24.  Last CBC Lab Results  Component Value Date   WBC 8.2 03/19/2024   HGB 12.4 03/19/2024   HCT 39.4 03/19/2024   MCV 76 (L) 03/19/2024   MCH 23.9 (L) 03/19/2024   RDW 13.7 03/19/2024   PLT 407 03/19/2024   Last metabolic panel Lab Results  Component Value Date   GLUCOSE 91 10/03/2023   NA 142 10/03/2023   K 3.6 10/03/2023   CL 100 10/03/2023   CO2 28 10/03/2023   BUN 13 10/03/2023   CREATININE 0.81 10/03/2023   EGFR 82 10/03/2023   CALCIUM 10.0 10/03/2023   PROT 6.2 10/03/2023   ALBUMIN 4.1 10/03/2023   LABGLOB 2.1 10/03/2023   BILITOT 0.3 10/03/2023   ALKPHOS 100 10/03/2023   AST 25 10/03/2023   ALT 26 10/03/2023   ANIONGAP 17 (H) 11/06/2020   Last lipids No results found for: CHOL, HDL, LDLCALC, LDLDIRECT, TRIG, CHOLHDL Last hemoglobin A1c Lab Results  Component Value Date   HGBA1C 6.1 (H) 10/03/2023      The ASCVD Risk score (Arnett DK, et al., 2019) failed to calculate for the following reasons:   Cannot find a previous HDL lab   Cannot find a previous total cholesterol lab    Assessment & Plan:   Assessment and Plan    Chronic cough Chronic cough persists,  possibly due to cough variant asthma vs COPD. Cough is sporadic and can be severe, leading to shortness of breath. Inhaler use provides relief, indicating possible asthma component. Post-infectious state may contribute to symptoms. She is much improved form her multi-lobar pneumonia. - Prescribe Tessalon  Perles for cough management - Continue current inhaler use as needed - Follow up with pulmonology next month for pulmonary function testing  Post-infectious fatigue Persistent fatigue following a severe infection. Symptoms include weight loss and deconditioning. She is gradually regaining weight and strength. - Encourage gradual increase in activity as tolerated  Hypertension Chronic, not at goal in clinic- stable at home supposedly. Blood pressure remains elevated, particularly in the morning. Home monitoring is ongoing. Current management includes medication taken at night. - Continue current antihypertensive medication regimen - Monitor blood pressure at home  Gastroesophageal reflux disease (GERD) Chronic, stable. GERD symptoms managed with Pepcid . Current regimen is 20 mg twice daily, but she prefers 40 mg once daily at night. - Adjust Pepcid  to 40 mg once daily at night     I personally spent a total of 22 minutes in the care of the patient today including preparing to see the patient, getting/reviewing separately obtained history, performing a medically appropriate exam/evaluation, counseling and educating, placing orders, and documenting clinical information in the EHR.   Return if symptoms worsen or fail to improve.    Seairra Otani T Koki Buxton, PA-C

## 2024-05-29 ENCOUNTER — Other Ambulatory Visit (HOSPITAL_BASED_OUTPATIENT_CLINIC_OR_DEPARTMENT_OTHER): Payer: Self-pay

## 2024-05-30 ENCOUNTER — Encounter (HOSPITAL_BASED_OUTPATIENT_CLINIC_OR_DEPARTMENT_OTHER): Payer: Self-pay

## 2024-05-30 MED ORDER — POTASSIUM CHLORIDE ER 20 MEQ PO TBCR: 40.0000 meq | EXTENDED_RELEASE_TABLET | Freq: Every day | ORAL | 3 refills | Status: DC

## 2024-06-14 ENCOUNTER — Encounter: Payer: Self-pay | Admitting: Internal Medicine

## 2024-06-14 ENCOUNTER — Ambulatory Visit (INDEPENDENT_AMBULATORY_CARE_PROVIDER_SITE_OTHER): Admitting: Internal Medicine

## 2024-06-14 VITALS — BP 110/82 | HR 74 | Temp 98.2°F | Ht 62.0 in | Wt 158.0 lb

## 2024-06-14 DIAGNOSIS — Z87891 Personal history of nicotine dependence: Secondary | ICD-10-CM

## 2024-06-14 DIAGNOSIS — J189 Pneumonia, unspecified organism: Secondary | ICD-10-CM | POA: Diagnosis not present

## 2024-06-14 DIAGNOSIS — R0602 Shortness of breath: Secondary | ICD-10-CM

## 2024-06-14 NOTE — Patient Instructions (Addendum)
 It was a pleasure to see you today!  Please schedule follow up with myself in 1 months.  If my schedule is not open yet, we will contact you with a reminder closer to that time. Please call [phone] if you haven't heard from us  a month before, and always call us  sooner if issues or concerns arise. You can also send us  a message through MyChart, but but aware that this is not to be used for urgent issues and it may take up to 5-7 days to receive a reply. Please be aware that you will likely be able to view your results before I have a chance to respond to them. Please give us  5 business days to respond to any non-urgent results.    Before your next visit I would like you to have: Full set of PFTs CT chest  YOUR PLAN:  -PERSISTENT DYSPNEA AND COUGH FOLLOWING MULTIFOCAL PNEUMONIA: Persistent dyspnea and cough mean ongoing shortness of breath and coughing after having pneumonia in multiple areas of the lungs. We will order a chest CT scan to check if the pneumonia has resolved. We will also conduct pulmonary function tests to assess your lung function. You should continue using the Breyna  inhaler, but if it makes you feel hyperactive, adjust the dose to one puff twice daily. We will coordinate the CT scan at the earliest available location and schedule the pulmonary function test and follow-up appointment on the same day.  INSTRUCTIONS:  Please continue using your Breyna  inhaler as directed, adjusting the dose if necessary. We will arrange for a chest CT scan and, based on the results, may schedule pulmonary function testing. Your follow-up appointment will be on the same day as the pulmonary function test. We will contact you with the details.

## 2024-06-14 NOTE — Progress Notes (Signed)
 Mikayla Hart    [id number]    Dec 27, 1960  Primary Care Physician:Rothfuss, Lang [name]  Referring Physician: Iven Lang ONEIDA, PA-C [address] [place],  [place] [address] Reason for Consultation: shortness of breath Date of Consultation: 06/14/2024  Chief complaint:   Chief Complaint  Patient presents with   Consult   Shortness of Breath    Sob when active or talking. Started after pneumonia diagnosis in may 2025      HPI: Discussed the use of AI scribe software for clinical note transcription with the patient, who gave verbal consent to proceed.  History of Present Illness Mikayla Hart is a 63 year old female with a history of pneumonia who presents with persistent respiratory symptoms.  In May, she experienced severe pain in her left back, leading to an emergency room visit where a CT scan revealed pneumonia in the left lower lobe. She was initially treated with levofloxacin  750 mg for five days, but her condition worsened to multifocal pneumonia, necessitating hospitalization at [facility] for four days on IV antibiotics. Her primary care physician noted clear lungs only by August 7.  She has persistent shortness of breath, especially when talking or moving quickly, and a dry, hacky cough. These symptoms have significantly impacted her daily activities, including her ability to walk her dogs and sing. She experiences chills almost every night, sometimes accompanied by sweating, but no fever. These symptoms have been ongoing since 2017, with a history of night sweats and chills predating her pneumonia.  She has a history of bronchitis in her early teens and twenties, but no chronic respiratory issues like asthma. She quit smoking cigarettes in 1997 after a 20-year history of smoking up to two packs a day. She currently uses an albuterol  inhaler and Breyna , a medication similar to Symbicort , to manage her breathing. Breyna  helps her breathing, although it makes her  feel hyper if used twice a day.  She has experienced significant weight loss since 2017, dropping from 230 pounds to 158 pounds, with episodes of vomiting and abdominal pain. She has been evaluated for these symptoms, including a GI consultation, but no definitive diagnosis was made. She started using cannabis after the onset of vomiting to alleviate symptoms.  Her family history includes a mother who had emphysema/COPD and siblings with autoimmune conditions like psoriasis and rheumatoid arthritis. She has been evaluated for Sjogren's syndrome, with mixed results from different doctors.  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 Exposures: smokes weed as a blunt, lives at home with husband, mother, 3 dogs Smoking history: 40 pack year smoking history quit 1997  Social History   Occupational History   Occupation: [name]    Comment: LPN   Occupation: Public relations account executive  Tobacco Use   Smoking status: Former    Current packs/day: 0.00    Average packs/day: 2.0 packs/day for 20.0 years (40.0 ttl pk-yrs)    Types: Cigarettes    Start date: 47    Quit date: 1997    Years since quitting: 28.7    Passive exposure: Never   Smokeless tobacco: Never   Tobacco comments:    Started smoking at age 78. Quit cigarette in 1997 Smokes tobacco w/ marijuana 2017-2025.  Vaping Use   Vaping status: Never Used  Substance and Sexual Activity   Alcohol use: Yes    Comment: rarely   Drug use: Yes    Types: Marijuana  Comment: as needed   Sexual activity: Not on file    Relevant family history:  Family History  Problem Relation Age of Onset   Hypertension Mother    Heart disease Mother    Stroke Mother        died of   Diabetes type II Mother    Glaucoma Father    Hypercholesterolemia Father    Rheum arthritis Sister    Psoriasis Sister     Past Medical History:  Diagnosis Date   Acid reflux    Acute encephalopathy  06/01/2020   Acute metabolic encephalopathy 06/02/2020   Benign essential tremor 05/18/2019   Cervical pain 07/16/2019   Chronic upper abdominal pain 10/03/2023   Degenerative spondylolisthesis 08/13/2020   Added automatically from request for surgery [id number]     Essential hypertension 06/02/2020   Gastroesophageal reflux disease without esophagitis 07/18/2019   Goiter 10/03/2023   Hypertension    Hypokalemia 06/02/2020   Hypomagnesemia 06/02/2020   Lumbar radiculopathy 01/10/2018   Microcytosis 05/18/2019   Microscopic hematuria 04/11/2023   Nausea and vomiting 10/03/2023   Non-intractable vomiting 06/02/2020   Osteoarthritis    Pulmonary hypertension (HCC) 05/18/2019   Mild, RVSP 29.4 mmHg 2018 TTE     Risk factors for obstructive sleep apnea 05/22/2020   DO I SNORE SCORE of 5, sleep referral declined (05/22/2020)     S/P lumbar fusion 10/28/2020   Screen for colon cancer 10/03/2023   Situational mixed anxiety and depressive disorder 08/02/2018   Sjogren's disease (HCC)    Spinal stenosis of lumbar region 07/16/2019   Vitamin D deficiency 02/27/2016    Past Surgical History:  Procedure Laterality Date   ABDOMINAL HYSTERECTOMY     BACK SURGERY     2   BUNIONECTOMY     CESAREAN SECTION     3   HAMMER TOE SURGERY     ORTHOPEDIC SURGERY     SALPINGECTOMY     2010     Physical Exam: Blood pressure 110/82, pulse 74, temperature 98.2 F (36.8 C), temperature source Oral, height 5' 2 (1.575 m), weight 158 lb (71.7 kg), SpO2 95%. Gen:      No acute distress ENT:  no nasal polyps, mucus membranes moist Lungs:    No increased respiratory effort, symmetric chest wall excursion, clear to auscultation bilaterally, no wheezes, faint left sided crackles CV:         Regular rate and rhythm; no murmurs, rubs, or gallops.  No pedal edema Abd:      + bowel sounds; soft, non-tender; no distension MSK: no acute synovitis of DIP or PIP joints, no mechanics hands.  Skin:      Warm  and dry; no rashes Neuro: normal speech, no focal facial asymmetry Psych: alert and oriented x3, normal mood and affect   Data Reviewed/Medical Decision Making:  Independent interpretation of tests: Imaging:  Review of patient's ct chest June 2025  images revealed no pe, multifocal pneumonia L>R. The patient's images have been independently reviewed by me.    PFTs:    Labs:  Lab Results  Component Value Date   WBC 8.2 03/19/2024   HGB 12.4 03/19/2024   HCT 39.4 03/19/2024   MCV 76 (L) 03/19/2024   PLT 407 03/19/2024   Lab Results  Component Value Date   NA 142 10/03/2023   K 3.6 10/03/2023   CO2 28 10/03/2023   GLUCOSE 91 10/03/2023   BUN 13 10/03/2023   CREATININE 0.81 10/03/2023  CALCIUM 10.0 10/03/2023   EGFR 82 10/03/2023   GFRNONAA >60 11/06/2020   Peripheral eosinophilia aec 500  Immunization status:  Immunization History  Administered Date(s) Administered   Influenza,inj,Quad PF,6+ Mos 09/17/2016, 07/04/2017, 08/02/2018, 07/18/2019, 09/24/2020, 09/11/2021, 05/05/2023   Influenza-Unspecified 05/27/2014   Moderna Sars-Covid-2 Vaccination 01/03/2020, 01/31/2020   Td (Adult),5 Lf Tetanus Toxid, Preservative Free 10/05/2003   Tdap 03/14/2013, 10/04/2014   Zoster Recombinant(Shingrix) 03/24/2022, 06/03/2022     I reviewed prior external note(s) from primary care  I reviewed the result(s) of the labs and imaging as noted above.   I have ordered pft, ct chest   Assessment and Plan Assessment & Plan Persistent dyspnea and cough following multifocal pneumonia Persistent dyspnea and cough post-multifocal pneumonia. Differential includes unresolved or aspiration pneumonia. Breyna  inhaler provides partial relief but causes hyperactivity. Albuterol  ineffective. - Order chest CT to assess pneumonia resolution. - Consider pulmonary function testing based on CT results. - Continue Breyna  inhaler, adjust to one puff twice daily if hyperactivity persists. -  Coordinate CT scan at earliest available location, including [place] if necessary. - Schedule pulmonary function testing and follow-up appointment on the same day.  History of tobacco use Quit >30 years ago   Return to Care: Return in about 6 weeks (around 07/26/2024) for PFT and same day appointment.  Verdon Gore, MD Pulmonary and Critical Care Medicine [facility] Office:[phone]  CC: Rothfuss, Lang DASEN, PA-C

## 2024-07-17 ENCOUNTER — Ambulatory Visit (HOSPITAL_BASED_OUTPATIENT_CLINIC_OR_DEPARTMENT_OTHER)
Admission: RE | Admit: 2024-07-17 | Discharge: 2024-07-17 | Disposition: A | Source: Ambulatory Visit | Attending: Internal Medicine | Admitting: Radiology

## 2024-07-17 DIAGNOSIS — R0989 Other specified symptoms and signs involving the circulatory and respiratory systems: Secondary | ICD-10-CM | POA: Diagnosis not present

## 2024-07-17 DIAGNOSIS — R0602 Shortness of breath: Secondary | ICD-10-CM

## 2024-07-17 DIAGNOSIS — J188 Other pneumonia, unspecified organism: Secondary | ICD-10-CM

## 2024-07-17 DIAGNOSIS — Z87891 Personal history of nicotine dependence: Secondary | ICD-10-CM | POA: Diagnosis not present

## 2024-07-26 ENCOUNTER — Other Ambulatory Visit (HOSPITAL_BASED_OUTPATIENT_CLINIC_OR_DEPARTMENT_OTHER): Payer: Self-pay | Admitting: Student

## 2024-07-26 MED ORDER — LOSARTAN POTASSIUM 100 MG PO TABS: 100.0000 mg | ORAL_TABLET | Freq: Every day | ORAL | 3 refills | Status: AC

## 2024-07-26 NOTE — Telephone Encounter (Signed)
 Copied from CRM #[id number]. Topic: Clinical - Medication Refill >> Jul 26, 2024 12:35 PM [name] wrote: Medication:  losartan  (COZAAR ) 100 MG tablet  Mikayla Hart stated he would take over prescribing medication.   Has the patient contacted their pharmacy? Yes (Agent: If no, request that the patient contact the pharmacy for the refill. If patient does not wish to contact the pharmacy document the reason why and proceed with request.) (Agent: If yes, when and what did the pharmacy advise?)  This is the patient's preferred pharmacy:  [facility] [address], [place] - [address] [place] [address] Phone: [phone] Fax: [phone]  Is this the correct pharmacy for this prescription? Yes If no, delete pharmacy and type the correct one.   Has the prescription been filled recently? No  Is the patient out of the medication? Yes  Has the patient been seen for an appointment in the last year OR does the patient have an upcoming appointment? Yes  Can we respond through MyChart? No.   Agent: Please be advised that Rx refills may take up to 3 business days. We ask that you follow-up with your pharmacy.

## 2024-07-30 ENCOUNTER — Ambulatory Visit (INDEPENDENT_AMBULATORY_CARE_PROVIDER_SITE_OTHER)

## 2024-07-30 DIAGNOSIS — R0602 Shortness of breath: Secondary | ICD-10-CM

## 2024-07-30 LAB — PULMONARY FUNCTION TEST
DL/VA % pred: 115 %
DL/VA: 4.88 ml/min/mmHg/L
DLCO unc % pred: 80 %
DLCO unc: 15.6 ml/min/mmHg
FEF 25-75 Post: 2.66 L/s
FEF 25-75 Pre: 2.68 L/s
FEF2575-%Change-Post: 0 %
FEF2575-%Pred-Post: 123 %
FEF2575-%Pred-Pre: 124 %
FEV1-%Change-Post: 0 %
FEV1-%Pred-Post: 75 %
FEV1-%Pred-Pre: 74 %
FEV1-Post: 1.8 L
FEV1-Pre: 1.78 L
FEV1FVC-%Change-Post: 1 %
FEV1FVC-%Pred-Pre: 112 %
FEV6-%Change-Post: 0 %
FEV6-%Pred-Post: 68 %
FEV6-%Pred-Pre: 68 %
FEV6-Post: 2.04 L
FEV6-Pre: 2.04 L
FEV6FVC-%Pred-Post: 103 %
FEV6FVC-%Pred-Pre: 103 %
FVC-%Change-Post: 0 %
FVC-%Pred-Post: 65 %
FVC-%Pred-Pre: 65 %
FVC-Post: 2.04 L
FVC-Pre: 2.04 L
Post FEV1/FVC ratio: 88 %
Post FEV6/FVC ratio: 100 %
Pre FEV1/FVC ratio: 87 %
Pre FEV6/FVC Ratio: 100 %
RV % pred: 65 %
RV: 1.3 L
TLC % pred: 70 %
TLC: 3.46 L

## 2024-07-30 NOTE — Patient Instructions (Signed)
 Full pft performed today

## 2024-07-30 NOTE — Progress Notes (Signed)
 Full pft performed today

## 2024-08-08 ENCOUNTER — Telehealth: Payer: Self-pay | Admitting: Internal Medicine

## 2024-08-08 ENCOUNTER — Ambulatory Visit: Admitting: Internal Medicine

## 2024-08-08 ENCOUNTER — Encounter: Payer: Self-pay | Admitting: Internal Medicine

## 2024-08-08 VITALS — BP 122/82 | HR 70 | Ht 62.5 in | Wt 260.0 lb

## 2024-08-08 DIAGNOSIS — M3509 Sicca syndrome with other organ involvement: Secondary | ICD-10-CM

## 2024-08-08 DIAGNOSIS — R0602 Shortness of breath: Secondary | ICD-10-CM

## 2024-08-08 DIAGNOSIS — R9389 Abnormal findings on diagnostic imaging of other specified body structures: Secondary | ICD-10-CM

## 2024-08-08 DIAGNOSIS — J984 Other disorders of lung: Secondary | ICD-10-CM | POA: Diagnosis not present

## 2024-08-08 NOTE — Progress Notes (Signed)
 Mikayla Hart    [id number]    1961-04-16  Primary Care Physician:Mikayla Hart Mikayla Hart Date of Appointment: 08/08/2024 Established Patient Visit  Chief complaint:   Chief Complaint  Patient presents with   Medical Management of Chronic Issues    Pt states been well / flu shot      HPI: Mikayla Hart is a 63 y.o. woman shortness of breath and recurrent pneumonia. History of Sjogren's disease.   Interval Updates: Here after CT Chest and PFTs which show patchy consolidative changes, architectural distortion, fibrosis and restriction to ventilation.   Ongoing dypnea on exertion.   She denies rashes, arthralgias, synovitis. Does have dry mouth. Has seen rheumatology was told she didn't have Ab to support sjogrens.   Sister - RA (sibling) Multiple siblings with plaque psoriasis. (Half siblings) Daughter - Sjogren's Niece - sister's daughter had sarcoidosis. She died from this.  No personal history of cancer.   Labs per [facility] Rheumatology note 11/09/2023 Mikayla Hart she did not have sjogrens or lupus. Likely osteoarthritis and maybe fibromyalgia.   11/09/23 for initial visit. Labs completed showed negative/normal blood counts, kidney and liver function, UA, SPEP, ANCA panel, UPEP, vitamin B12 and folate, CK, RPR, HIV, free kappa light chains, free lambda light chains.   AVISE CTD panel showed ANA IgG 26.98 (+20-60, strong+>60), and negative/normal ANA by HEP2, dsDNA, Smith, EC4d, BC4d, SSB, SCL 70, centromere protein B, Jo 1, CCP, TC4d, T IgG, T IgM, anti-RA 33 IgG/M/A, rheumatoid factor IgM/A, SSA Ro 52, SSA Ro 60, U1 RNP, RNP 70, cardiolipin IgM/G, beta-2 glycoprotein 1 IgM/G, thyroglobulin IgG, thyroid peroxidase IgG.   Past Medical History:  Diagnosis Date   Acid reflux    Acute encephalopathy 06/01/2020   Acute metabolic encephalopathy 06/02/2020   Benign essential tremor 05/18/2019   Cervical pain 07/16/2019   Chronic upper abdominal pain 10/03/2023    Degenerative spondylolisthesis 08/13/2020   Added automatically from request for surgery [id number]     Essential hypertension 06/02/2020   Gastroesophageal reflux disease without esophagitis 07/18/2019   Goiter 10/03/2023   Hypertension    Hypokalemia 06/02/2020   Hypomagnesemia 06/02/2020   Lumbar radiculopathy 01/10/2018   Microcytosis 05/18/2019   Microscopic hematuria 04/11/2023   Nausea and vomiting 10/03/2023   Non-intractable vomiting 06/02/2020   Osteoarthritis    Pulmonary hypertension (HCC) 05/18/2019   Mild, RVSP 29.4 mmHg 2018 TTE     Risk factors for obstructive sleep apnea 05/22/2020   DO I SNORE SCORE of 5, sleep referral declined (05/22/2020)     S/P lumbar fusion 10/28/2020   Screen for colon cancer 10/03/2023   Situational mixed anxiety and depressive disorder 08/02/2018   Sjogren's disease    Spinal stenosis of lumbar region 07/16/2019   Vitamin D deficiency 02/27/2016    Past Surgical History:  Procedure Laterality Date   ABDOMINAL HYSTERECTOMY     BACK SURGERY     2   BUNIONECTOMY     CESAREAN SECTION     3   HAMMER TOE SURGERY     ORTHOPEDIC SURGERY     SALPINGECTOMY     2010    Family History  Problem Relation Age of Onset   Hypertension Mother    Heart disease Mother    Stroke Mother        died of   Diabetes type II Mother    Glaucoma Father    Hypercholesterolemia Father    Rheum arthritis Sister  Psoriasis Sister     Social History   Occupational History   Occupation: [employer]    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 Quit cigarette in 1997 Smokes  tobacco w/ marijuana 2017-2025.  Vaping Use   Vaping status: Never Used  Substance and Sexual Activity   Alcohol use: Yes    Comment: rarely   Drug use: Yes    Types: Marijuana    Comment: as needed   Sexual activity: Not on file     Physical Exam: Blood pressure 122/82, pulse 70, height 5' 2.5 (1.588 m), weight 260 lb (117.9 kg), SpO2 97%.  Gen:      No acute distress ENT:  no nasal polyps, mucus membranes moist Lungs:    No increased respiratory effort, symmetric chest wall excursion, clear to auscultation bilaterally, no wheezes or crackles CV:         Regular rate and rhythm; no murmurs, rubs, or gallops.  No pedal edema   Data Reviewed: Imaging: I have personally reviewed the CT Chest 07/2024 patchy consolidative changes, architectural distortion, fibrosis and restriction to ventilation.   PFTs:     Latest Ref Rng & Units 07/30/2024    3:30 PM  PFT Results  FVC-Pre L 2.04  P  FVC-Predicted Pre % 65  P  FVC-Post L 2.04  P  FVC-Predicted Post % 65  P  Pre FEV1/FVC % % 87  P  Post FEV1/FCV % % 88  P  FEV1-Pre L 1.78  P  FEV1-Predicted Pre % 74  P  FEV1-Post L 1.80  P  DLCO uncorrected ml/min/mmHg 15.60  P  DLCO UNC% % 80  P  DLVA Predicted % 115  P  TLC L 3.46  P  TLC % Predicted % 70  P  RV % Predicted % 65  P    P Preliminary result   I have personally reviewed the patient's PFTs and mild restriction to ventilation  Labs: Lab Results  Component Value Date   NA 142 10/03/2023   K 3.6 10/03/2023   CO2 28 10/03/2023   GLUCOSE 91 10/03/2023   BUN 13 10/03/2023   CREATININE 0.81 10/03/2023   CALCIUM 10.0 10/03/2023   EGFR 82 10/03/2023   GFRNONAA >60 11/06/2020   Lab Results  Component Value Date   WBC 8.2 03/19/2024   HGB 12.4 03/19/2024   HCT 39.4 03/19/2024   MCV 76 (L) 03/19/2024   PLT 407 03/19/2024    Immunization status: Immunization History  Administered Date(s) Administered   Influenza,inj,Quad PF,6+ Mos 09/17/2016, 07/04/2017,  08/02/2018, 07/18/2019, 09/24/2020, 09/11/2021, 05/05/2023   Influenza-Unspecified 05/27/2014   Moderna Sars-Covid-2 Vaccination 01/03/2020, 01/31/2020   Td (Adult),5 Lf Tetanus Toxid, Preservative Free 10/05/2003   Tdap 03/14/2013, 10/04/2014   Zoster Recombinant(Shingrix) 03/24/2022, 06/03/2022     Assessment:  Diffuse Parenchymal Lung Disease Mild restriction to ventilation Sjogren's with extensive family h/o autoimmune disease  Plan/Recommendations:  CT Chest demonstrates patchy consolidative changes with some surrounding ground glass.  Some evidence of traction bronchiectasis.  This is  concerning for sarcoidosis.  Patchy consolidative changes could also be organizing pneumonia.  She has on this noncontrast CT chest some mild base time out and hilar lymphadenopathy as well.  I discussed with her that we need to set up a bronchoscopy to obtain a tissue diagnosis.  I will coordinate with my colleagues to arrange this.  I would like to have her follow-up with Mikayla. Adrien moving forward to establish care.  May benefit from additional serologic testing pending biopsy results.  I was unabl  I spent 40 minutes on 08/08/2024 in care of this patient including face to face time and non-face to face time spent charting, review of outside records rhematology, and coordination of care including referrals, tests, bronchoscopy.   Return to Care: Return in about 6 weeks (around 09/19/2024) for Mikayla Adrien.   Verdon Gore, MD Pulmonary and Critical Care Medicine [facility] [phone]

## 2024-08-08 NOTE — H&P (View-Only) (Signed)
 Mikayla Hart    [id number]    1961-04-16  Primary Care Physician:Hart, Mikayla Mikayla Hart Date of Appointment: 08/08/2024 Established Patient Visit  Chief complaint:   Chief Complaint  Patient presents with   Medical Management of Chronic Issues    Pt states been well / flu shot      HPI: Mikayla Hart is a 63 y.o. woman shortness of breath and recurrent pneumonia. History of Sjogren's disease.   Interval Updates: Here after CT Chest and PFTs which show patchy consolidative changes, architectural distortion, fibrosis and restriction to ventilation.   Ongoing dypnea on exertion.   She denies rashes, arthralgias, synovitis. Does have dry mouth. Has seen rheumatology was told she didn't have Ab to support sjogrens.   Sister - RA (sibling) Multiple siblings with plaque psoriasis. (Half siblings) Daughter - Sjogren's Niece - sister's daughter had sarcoidosis. She died from this.  No personal history of cancer.   Labs per [facility] Rheumatology note 11/09/2023 Dr Mikayla Hart she did not have sjogrens or lupus. Likely osteoarthritis and maybe fibromyalgia.   11/09/23 for initial visit. Labs completed showed negative/normal blood counts, kidney and liver function, UA, SPEP, ANCA panel, UPEP, vitamin B12 and folate, CK, RPR, HIV, free kappa light chains, free lambda light chains.   AVISE CTD panel showed ANA IgG 26.98 (+20-60, strong+>60), and negative/normal ANA by HEP2, dsDNA, Smith, EC4d, BC4d, SSB, SCL 70, centromere protein B, Jo 1, CCP, TC4d, T IgG, T IgM, anti-RA 33 IgG/M/A, rheumatoid factor IgM/A, SSA Ro 52, SSA Ro 60, U1 RNP, RNP 70, cardiolipin IgM/G, beta-2 glycoprotein 1 IgM/G, thyroglobulin IgG, thyroid peroxidase IgG.   Past Medical History:  Diagnosis Date   Acid reflux    Acute encephalopathy 06/01/2020   Acute metabolic encephalopathy 06/02/2020   Benign essential tremor 05/18/2019   Cervical pain 07/16/2019   Chronic upper abdominal pain 10/03/2023    Degenerative spondylolisthesis 08/13/2020   Added automatically from request for surgery [id number]     Essential hypertension 06/02/2020   Gastroesophageal reflux disease without esophagitis 07/18/2019   Goiter 10/03/2023   Hypertension    Hypokalemia 06/02/2020   Hypomagnesemia 06/02/2020   Lumbar radiculopathy 01/10/2018   Microcytosis 05/18/2019   Microscopic hematuria 04/11/2023   Nausea and vomiting 10/03/2023   Non-intractable vomiting 06/02/2020   Osteoarthritis    Pulmonary hypertension (HCC) 05/18/2019   Mild, RVSP 29.4 mmHg 2018 TTE     Risk factors for obstructive sleep apnea 05/22/2020   DO I SNORE SCORE of 5, sleep referral declined (05/22/2020)     S/P lumbar fusion 10/28/2020   Screen for colon cancer 10/03/2023   Situational mixed anxiety and depressive disorder 08/02/2018   Sjogren's disease    Spinal stenosis of lumbar region 07/16/2019   Vitamin D deficiency 02/27/2016    Past Surgical History:  Procedure Laterality Date   ABDOMINAL HYSTERECTOMY     BACK SURGERY     2   BUNIONECTOMY     CESAREAN SECTION     3   HAMMER TOE SURGERY     ORTHOPEDIC SURGERY     SALPINGECTOMY     2010    Family History  Problem Relation Age of Onset   Hypertension Mother    Heart disease Mother    Stroke Mother        died of   Diabetes type II Mother    Glaucoma Father    Hypercholesterolemia Father    Rheum arthritis Sister  Psoriasis Sister     Social History   Occupational History   Occupation: [employer]    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 Quit cigarette in 1997 Smokes  tobacco w/ marijuana 2017-2025.  Vaping Use   Vaping status: Never Used  Substance and Sexual Activity   Alcohol use: Yes    Comment: rarely   Drug use: Yes    Types: Marijuana    Comment: as needed   Sexual activity: Not on file     Physical Exam: Blood pressure 122/82, pulse 70, height 5' 2.5 (1.588 m), weight 260 lb (117.9 kg), SpO2 97%.  Gen:      No acute distress ENT:  no nasal polyps, mucus membranes moist Lungs:    No increased respiratory effort, symmetric chest wall excursion, clear to auscultation bilaterally, no wheezes or crackles CV:         Regular rate and rhythm; no murmurs, rubs, or gallops.  No pedal edema   Data Reviewed: Imaging: I have personally reviewed the CT Chest 07/2024 patchy consolidative changes, architectural distortion, fibrosis and restriction to ventilation.   PFTs:     Latest Ref Rng & Units 07/30/2024    3:30 PM  PFT Results  FVC-Pre L 2.04  P  FVC-Predicted Pre % 65  P  FVC-Post L 2.04  P  FVC-Predicted Post % 65  P  Pre FEV1/FVC % % 87  P  Post FEV1/FCV % % 88  P  FEV1-Pre L 1.78  P  FEV1-Predicted Pre % 74  P  FEV1-Post L 1.80  P  DLCO uncorrected ml/min/mmHg 15.60  P  DLCO UNC% % 80  P  DLVA Predicted % 115  P  TLC L 3.46  P  TLC % Predicted % 70  P  RV % Predicted % 65  P    P Preliminary result   I have personally reviewed the patient's PFTs and mild restriction to ventilation  Labs: Lab Results  Component Value Date   NA 142 10/03/2023   K 3.6 10/03/2023   CO2 28 10/03/2023   GLUCOSE 91 10/03/2023   BUN 13 10/03/2023   CREATININE 0.81 10/03/2023   CALCIUM 10.0 10/03/2023   EGFR 82 10/03/2023   GFRNONAA >60 11/06/2020   Lab Results  Component Value Date   WBC 8.2 03/19/2024   HGB 12.4 03/19/2024   HCT 39.4 03/19/2024   MCV 76 (L) 03/19/2024   PLT 407 03/19/2024    Immunization status: Immunization History  Administered Date(s) Administered   Influenza,inj,Quad PF,6+ Mos 09/17/2016, 07/04/2017,  08/02/2018, 07/18/2019, 09/24/2020, 09/11/2021, 05/05/2023   Influenza-Unspecified 05/27/2014   Moderna Sars-Covid-2 Vaccination 01/03/2020, 01/31/2020   Td (Adult),5 Lf Tetanus Toxid, Preservative Free 10/05/2003   Tdap 03/14/2013, 10/04/2014   Zoster Recombinant(Shingrix) 03/24/2022, 06/03/2022     Assessment:  Diffuse Parenchymal Lung Disease Mild restriction to ventilation Sjogren's with extensive family h/o autoimmune disease  Plan/Recommendations:  CT Chest demonstrates patchy consolidative changes with some surrounding ground glass.  Some evidence of traction bronchiectasis.  This is  concerning for sarcoidosis.  Patchy consolidative changes could also be organizing pneumonia.  She has on this noncontrast CT chest some mild base time out and hilar lymphadenopathy as well.  I discussed with her that we need to set up a bronchoscopy to obtain a tissue diagnosis.  I will coordinate with my colleagues to arrange this.  I would like to have her follow-up with Dr. Adrien moving forward to establish care.  May benefit from additional serologic testing pending biopsy results.  I was unabl  I spent 40 minutes on 08/08/2024 in care of this patient including face to face time and non-face to face time spent charting, review of outside records rhematology, and coordination of care including referrals, tests, bronchoscopy.   Return to Care: Return in about 6 weeks (around 09/19/2024) for Dr Adrien.   Verdon Gore, MD Pulmonary and Critical Care Medicine [facility] [phone]

## 2024-08-08 NOTE — Patient Instructions (Addendum)
 It was a pleasure to see you today!  Please schedule follow up with Dr Adrien in 6 weeks.  If my schedule is not open yet, we will contact you with a reminder closer to that time. Please call [phone] if you haven't heard from us  a month before, and always call us  sooner if issues or concerns arise. You can also send us  a message through MyChart, but but aware that this is not to be used for urgent issues and it may take up to 5-7 days to receive a reply. Please be aware that you will likely be able to view your results before I have a chance to respond to them. Please give us  5 business days to respond to any non-urgent results.    Before your next visit I would like you to have: Bronchoscopy   I will coordinate the bronchoscopy with 1 my colleagues before the end of the year and will reach out to you in the next week.  You can continue the Breyna  daily if it is helping your shortness of breath.  Your breathing testing and CT scan show some changes concerning for a diffuse lung disease.  It could be related to an autoimmune condition.  It could also be sarcoidosis.  We will need the bronchoscopy and tissue diagnosis to guide next steps.

## 2024-08-10 ENCOUNTER — Telehealth: Payer: Self-pay

## 2024-08-10 DIAGNOSIS — J188 Other pneumonia, unspecified organism: Secondary | ICD-10-CM | POA: Insufficient documentation

## 2024-08-10 DIAGNOSIS — R9389 Abnormal findings on diagnostic imaging of other specified body structures: Secondary | ICD-10-CM | POA: Insufficient documentation

## 2024-08-10 DIAGNOSIS — M3509 Sicca syndrome with other organ involvement: Secondary | ICD-10-CM

## 2024-08-10 NOTE — Telephone Encounter (Signed)
 Please schedule the following:  Provider performing procedure:Trejan Buda Diagnosis: Bilateral pulmonary infiltrates  Which side if for nodule / mass? Bilateral  Procedure: Bronchoscopy with transbronchial biopsies and BAL  + EBUS Has patient been spoken to by Provider and given informed consent? Yes Anesthesia: General Do you need Fluro? Yes Duration of procedure: 60 minutes Date: 08/23/24 Alternate Date: N/A  Time: N/A Location: [facility] endo Does patient have OSA? No DM? No Or Latex allergy? No Medication Restriction/ Anticoagulate/Antiplatelet: None Pre-op Labs Ordered:determined by Anesthesia Imaging request: None  (If, SuperDimension CT Chest, please have STAT courier sent to ENDO)

## 2024-08-10 NOTE — Telephone Encounter (Signed)
 Sending to Shakimah to auth. LVM with details and mailing letter as well

## 2024-08-21 ENCOUNTER — Other Ambulatory Visit: Payer: Self-pay

## 2024-08-21 ENCOUNTER — Encounter (HOSPITAL_COMMUNITY): Payer: Self-pay

## 2024-08-21 NOTE — Progress Notes (Signed)
 PCP - Lang Alberta, PA-C Cardiologist - denies  PPM/ICD - denies   Chest x-ray - 03/19/24 EKG - 03/19/24 Stress Test - denies ECHO - 01/12/21 Cardiac Cath - denies  CPAP - denies  DM- denies  Blood Thinner Instructions: n/a Aspirin Instructions: f/u with surgeon  ERAS Protcol - no, NPO  COVID TEST- n/a  Anesthesia review: no  Patient verbally denies any shortness of breath, fever, cough and chest pain during phone call      Questions were answered. Patient verbalized understanding of instructions.

## 2024-08-21 NOTE — Pre-Procedure Instructions (Signed)
-------------    SDW INSTRUCTIONS given:  Your procedure is scheduled on 11/20.  Report to [facility] Main Entrance A at 09:30 A.M., and check in at the Admitting office.  Any questions or running late day of surgery: call [phone]    Remember:  Do not eat or drink after midnight the night before your surgery     Take these medicines the morning of surgery with A SIP OF WATER  Symbicort   pepcid     May take these medicines IF NEEDED: Albuterol - bring inhaler with you   Follow your surgeon's instructions on when to stop Aspirin.  If no instructions were given by your surgeon then you will need to call the office to get those instructions.    As of today, STOP taking any Aleve, Naproxen, Ibuprofen, Motrin, Advil, Goody's, BC's, all herbal medications, fish oil, and all vitamins.   Do NOT Smoke (Tobacco/Vaping) 24 hours prior to your procedure  If you use a CPAP at night, you may bring all equipment for your overnight stay.     You will be asked to remove any contacts, glasses, piercing's, hearing aid's, dentures/partials prior to surgery. Please bring cases for these items if needed.     Patients discharged the day of surgery will not be allowed to drive home, and someone needs to stay with them for 24 hours.  SURGICAL WAITING ROOM VISITATION Patients may have no more than 2 support people in the waiting area - these visitors may rotate.   Pre-op nurse will coordinate an appropriate time for 1 ADULT support person, who may not rotate, to accompany patient in pre-op.  Children under the age of 19 must have an adult with them who is not the patient and must remain in the main waiting area with an adult.  If the patient needs to stay at the hospital during part of their recovery, the visitor guidelines for inpatient rooms apply.  Please refer to the [facility] website for the visitor guidelines for any additional information.   Special instructions:   [facility]-  Preparing For Surgery   Please follow these instructions carefully.   Shower the NIGHT BEFORE SURGERY and the MORNING OF SURGERY with DIAL Soap.   Pat yourself dry with a CLEAN TOWEL.  Wear CLEAN PAJAMAS to bed the night before surgery  Place CLEAN SHEETS on your bed the night of your first shower and DO NOT SLEEP WITH PETS.   Additional instructions for the day of surgery: DO NOT APPLY any lotions, deodorants, cologne, or perfumes.   Do not wear jewelry or makeup Do not wear nail polish, gel polish, artificial nails, or any other type of covering on natural nails (fingers and toes) Do not bring valuables to the hospital. [facility] is not responsible for valuables/personal belongings. Put on clean/comfortable clothes.  Please brush your teeth.  Ask your nurse before applying any prescription medications to the skin.

## 2024-08-22 NOTE — Anesthesia Preprocedure Evaluation (Signed)
 Anesthesia Evaluation  Patient identified by MRN, date of birth, ID band Patient awake    Reviewed: Allergy & Precautions, NPO status , Patient's Chart, lab work & pertinent test results  History of Anesthesia Complications Negative for: history of anesthetic complications  Airway Mallampati: II  TM Distance: >3 FB Neck ROM: Full    Dental no notable dental hx. (+) Upper Dentures, Dental Advisory Given   Pulmonary pneumonia, resolved, former smoker   Pulmonary exam normal breath sounds clear to auscultation       Cardiovascular hypertension, Pt. on medications (-) angina (-) Past MI Normal cardiovascular exam Rhythm:Regular Rate:Normal     Neuro/Psych  PSYCHIATRIC DISORDERS Anxiety Depression       GI/Hepatic Neg liver ROS,GERD  ,,  Endo/Other  neg diabetes    Renal/GU Lab Results      Component                Value               Date                          K                        3.4 (L)             08/23/2024                CO2                      27                  08/23/2024                BUN                      14                  08/23/2024                CREATININE               PENDING             08/23/2024                GFRNONAA                 PENDING             08/23/2024                   Musculoskeletal  (+) Arthritis ,    Abdominal   Peds  Hematology Lab Results      Component                Value               Date                      WBC                      6.7                 08/23/2024                HGB  13.4                08/23/2024                HCT                      43.6                08/23/2024                MCV                      74.3 (L)            08/23/2024                PLT                      330                 08/23/2024              Anesthesia Other Findings ALL: morphine, Amlodipine , clonidine, tamadol, sulfa.tetracycline   Reproductive/Obstetrics                              Anesthesia Physical Anesthesia Plan  ASA: 3  Anesthesia Plan: General   Post-op Pain Management: Minimal or no pain anticipated   Induction:   PONV Risk Score and Plan: Propofol  infusion, TIVA, Treatment may vary due to age or medical condition and Ondansetron   Airway Management Planned: Oral ETT  Additional Equipment: None  Intra-op Plan:   Post-operative Plan: Extubation in OR  Informed Consent: I have reviewed the patients History and Physical, chart, labs and discussed the procedure including the risks, benefits and alternatives for the proposed anesthesia with the patient or authorized representative who has indicated his/her understanding and acceptance.     Dental advisory given  Plan Discussed with: CRNA  Anesthesia Plan Comments:          Anesthesia Quick Evaluation

## 2024-08-23 ENCOUNTER — Ambulatory Visit (HOSPITAL_COMMUNITY): Payer: Self-pay | Admitting: Anesthesiology

## 2024-08-23 ENCOUNTER — Encounter (HOSPITAL_COMMUNITY): Admission: RE | Disposition: A | Discharge status: Home / Self Care | Payer: Self-pay | Source: Home / Self Care

## 2024-08-23 ENCOUNTER — Ambulatory Visit (HOSPITAL_COMMUNITY)

## 2024-08-23 ENCOUNTER — Ambulatory Visit (HOSPITAL_COMMUNITY): Admission: RE | Admit: 2024-08-23 | Discharge: 2024-08-23 | Disposition: A | Discharge status: Home / Self Care

## 2024-08-23 ENCOUNTER — Encounter (HOSPITAL_COMMUNITY): Payer: Self-pay

## 2024-08-23 ENCOUNTER — Other Ambulatory Visit: Payer: Self-pay

## 2024-08-23 DIAGNOSIS — R918 Other nonspecific abnormal finding of lung field: Secondary | ICD-10-CM | POA: Insufficient documentation

## 2024-08-23 DIAGNOSIS — Z87891 Personal history of nicotine dependence: Secondary | ICD-10-CM | POA: Diagnosis not present

## 2024-08-23 DIAGNOSIS — F418 Other specified anxiety disorders: Secondary | ICD-10-CM

## 2024-08-23 DIAGNOSIS — R9389 Abnormal findings on diagnostic imaging of other specified body structures: Secondary | ICD-10-CM | POA: Insufficient documentation

## 2024-08-23 DIAGNOSIS — R590 Localized enlarged lymph nodes: Secondary | ICD-10-CM | POA: Diagnosis not present

## 2024-08-23 DIAGNOSIS — Z8701 Personal history of pneumonia (recurrent): Secondary | ICD-10-CM | POA: Diagnosis not present

## 2024-08-23 DIAGNOSIS — I10 Essential (primary) hypertension: Secondary | ICD-10-CM | POA: Diagnosis not present

## 2024-08-23 DIAGNOSIS — Z832 Family history of diseases of the blood and blood-forming organs and certain disorders involving the immune mechanism: Secondary | ICD-10-CM | POA: Diagnosis not present

## 2024-08-23 DIAGNOSIS — M3509 Sicca syndrome with other organ involvement: Secondary | ICD-10-CM | POA: Diagnosis not present

## 2024-08-23 DIAGNOSIS — M3500 Sicca syndrome, unspecified: Secondary | ICD-10-CM | POA: Insufficient documentation

## 2024-08-23 DIAGNOSIS — J188 Other pneumonia, unspecified organism: Secondary | ICD-10-CM | POA: Insufficient documentation

## 2024-08-23 HISTORY — DX: Pneumonia, unspecified organism: J18.9

## 2024-08-23 HISTORY — PX: VIDEO BRONCHOSCOPY: SHX5072

## 2024-08-23 HISTORY — DX: Dyspnea, unspecified: R06.00

## 2024-08-23 HISTORY — PX: VIDEO BRONCHOSCOPY WITH ENDOBRONCHIAL ULTRASOUND: SHX6177

## 2024-08-23 LAB — BODY FLUID CELL COUNT WITH DIFFERENTIAL
Eos, Fluid: 16 %
Lymphs, Fluid: 31 %
Monocyte-Macrophage-Serous Fluid: 6 % — ABNORMAL LOW (ref 50–90)
Neutrophil Count, Fluid: 46 % — ABNORMAL HIGH (ref 0–25)
Total Nucleated Cell Count, Fluid: 59 uL (ref 0–1000)

## 2024-08-23 LAB — CBC
HCT: 43.6 % (ref 36.0–46.0)
Hemoglobin: 13.4 g/dL (ref 12.0–15.0)
MCH: 22.8 pg — ABNORMAL LOW (ref 26.0–34.0)
MCHC: 30.7 g/dL (ref 30.0–36.0)
MCV: 74.3 fL — ABNORMAL LOW (ref 80.0–100.0)
Platelets: 330 K/uL (ref 150–400)
RBC: 5.87 MIL/uL — ABNORMAL HIGH (ref 3.87–5.11)
RDW: 14.9 % (ref 11.5–15.5)
WBC: 6.7 K/uL (ref 4.0–10.5)
nRBC: 0 % (ref 0.0–0.2)

## 2024-08-23 LAB — BASIC METABOLIC PANEL WITH GFR
Anion gap: 12 (ref 5–15)
BUN: 14 mg/dL (ref 8–23)
CO2: 27 mmol/L (ref 22–32)
Calcium: 10.2 mg/dL (ref 8.9–10.3)
Chloride: 99 mmol/L (ref 98–111)
Creatinine, Ser: 0.81 mg/dL (ref 0.44–1.00)
GFR, Estimated: >60 mL/min (ref >60)
Glucose, Bld: 91 mg/dL (ref 70–99)
Potassium: 3.4 mmol/L — ABNORMAL LOW (ref 3.5–5.1)
Sodium: 138 mmol/L (ref 135–145)

## 2024-08-23 SURGERY — BRONCHOSCOPY, WITH FLUOROSCOPY
Anesthesia: General | Laterality: Bilateral

## 2024-08-23 MED ORDER — LACTATED RINGERS IV SOLN: INTRAVENOUS | Status: DC

## 2024-08-23 MED ORDER — PROPOFOL 10 MG/ML IV BOLUS
INTRAVENOUS | Status: DC | PRN
  Administered 2024-08-23: 110 mg via INTRAVENOUS

## 2024-08-23 MED ORDER — ROCURONIUM BROMIDE 10 MG/ML (PF) SYRINGE
PREFILLED_SYRINGE | INTRAVENOUS | Status: DC | PRN
  Administered 2024-08-23: 40 mg via INTRAVENOUS

## 2024-08-23 MED ORDER — ONDANSETRON HCL 4 MG/2ML IJ SOLN
INTRAMUSCULAR | Status: DC | PRN
  Administered 2024-08-23: 4 mg via INTRAVENOUS

## 2024-08-23 MED ORDER — CHLORHEXIDINE GLUCONATE 0.12 % MT SOLN
OROMUCOSAL | Status: AC
  Filled 2024-08-23: qty 15

## 2024-08-23 MED ORDER — FENTANYL CITRATE (PF) 250 MCG/5ML IJ SOLN
INTRAMUSCULAR | Status: DC | PRN
  Administered 2024-08-23: 100 ug via INTRAVENOUS

## 2024-08-23 MED ORDER — SUGAMMADEX SODIUM 200 MG/2ML IV SOLN
INTRAVENOUS | Status: DC | PRN
  Administered 2024-08-23: 200 mg via INTRAVENOUS

## 2024-08-23 MED ORDER — PROPOFOL 500 MG/50ML IV EMUL
INTRAVENOUS | Status: DC | PRN
  Administered 2024-08-23: 150 ug/kg/min via INTRAVENOUS

## 2024-08-23 MED ORDER — CHLORHEXIDINE GLUCONATE 0.12 % MT SOLN
15.0000 mL | Freq: Once | OROMUCOSAL | Status: AC
  Administered 2024-08-23: 15 mL via OROMUCOSAL

## 2024-08-23 MED ORDER — FENTANYL CITRATE (PF) 100 MCG/2ML IJ SOLN
INTRAMUSCULAR | Status: AC
  Filled 2024-08-23: qty 2

## 2024-08-23 MED ORDER — LIDOCAINE 2% (20 MG/ML) 5 ML SYRINGE
INTRAMUSCULAR | Status: DC | PRN
  Administered 2024-08-23: 40 mg via INTRAVENOUS

## 2024-08-23 MED ORDER — DEXAMETHASONE SOD PHOSPHATE PF 10 MG/ML IJ SOLN
INTRAMUSCULAR | Status: DC | PRN
Start: 2024-08-23 — End: 2024-08-23
  Administered 2024-08-23: 10 mg via INTRAVENOUS

## 2024-08-23 NOTE — Anesthesia Procedure Notes (Signed)
 Procedure Name: Intubation Date/Time: 08/23/2024 12:13 PM  Performed by: Canda Augustin KIDD, CRNAPre-anesthesia Checklist: Patient identified, Emergency Drugs available, Suction available and Patient being monitored Patient Re-evaluated:Patient Re-evaluated prior to induction Oxygen Delivery Method: Circle System Utilized Preoxygenation: Pre-oxygenation with 100% oxygen Induction Type: IV induction Ventilation: Mask ventilation without difficulty Laryngoscope Size: Miller and 2 Grade View: Grade I Tube type: Oral Tube size: 7.0 mm Number of attempts: 1 Airway Equipment and Method: Stylet and Oral airway Placement Confirmation: ETT inserted through vocal cords under direct vision, positive ETCO2 and breath sounds checked- equal and bilateral Secured at: 22 cm Tube secured with: Tape Dental Injury: Teeth and Oropharynx as per pre-operative assessment

## 2024-08-23 NOTE — Interval H&P Note (Signed)
 History and Physical Interval Note:  08/23/2024 11:39 AM  Mikayla Hart  has presented today for surgery, with the diagnosis of bilateral lung infiltrates and nodules.  The various methods of treatment have been discussed with the patient and family. After consideration of risks, benefits and other options for treatment, the patient has consented to  Procedure(s): BRONCHOSCOPY, WITH FLUOROSCOPY (Bilateral) BRONCHOSCOPY, WITH EBUS (Bilateral) as a surgical intervention.  The patient's history has been reviewed, patient examined, no change in status, stable for surgery.  I have reviewed the patient's chart and labs.  Questions were answered to the patient's satisfaction.     Zola LOISE Herter

## 2024-08-23 NOTE — Transfer of Care (Signed)
 Immediate Anesthesia Transfer of Care Note  Patient: Mikayla Hart  Procedure(s) Performed: BRONCHOSCOPY, WITH FLUOROSCOPY (Bilateral) BRONCHOSCOPY, WITH EBUS (Bilateral)  Patient Location: PACU  Anesthesia Type:General  Level of Consciousness: awake, alert , and oriented  Airway & Oxygen Therapy: Patient Spontanous Breathing and Patient connected to nasal cannula oxygen  Post-op Assessment:   Post vital signs: Reviewed and stable  Last Vitals:  Vitals Value Taken TimeReport given to RN and Post -op Vital signs reviewed and stable  BP 126/91 08/23/24 13:20  Temp 37.2 C 08/23/24 13:20  Pulse 75 08/23/24 13:27  Resp 24 08/23/24 13:27  SpO2 97 % 08/23/24 13:27  Vitals shown include unfiled device data.  Last Pain:  Vitals:   08/23/24 1320  TempSrc:   PainSc: 0-No pain      Patients Stated Pain Goal: 1 (08/23/24 0944)  Complications: No notable events documented.

## 2024-08-23 NOTE — Anesthesia Postprocedure Evaluation (Signed)
 Anesthesia Post Note  Patient: Mikayla Hart  Procedure(s) Performed: BRONCHOSCOPY, WITH FLUOROSCOPY (Bilateral) BRONCHOSCOPY, WITH EBUS (Bilateral)     Patient location during evaluation: PACU Anesthesia Type: General Level of consciousness: awake and alert Pain management: pain level controlled Vital Signs Assessment: post-procedure vital signs reviewed and stable Respiratory status: spontaneous breathing, nonlabored ventilation, respiratory function stable and patient connected to nasal cannula oxygen Cardiovascular status: blood pressure returned to baseline and stable Postop Assessment: no apparent nausea or vomiting Anesthetic complications: no   No notable events documented.  Last Vitals:  Vitals:   08/23/24 1345 08/23/24 1400  BP: 123/77 131/83  Pulse: 74 71  Resp: 17 20  Temp:  37.2 C  SpO2: 98% 95%    Last Pain:  Vitals:   08/23/24 1400  TempSrc:   PainSc: 1                  Garnette DELENA Gab

## 2024-08-23 NOTE — Op Note (Signed)
 Flexible and EBUS Bronchoscopy Procedure Note  Trinity Haun  [id number]  [birth year]  Date:08/23/24  Time:1:10 PM   Provider Performing:Deziyah Arvin N Kvion Shapley   Procedure: Flexible bronchoscopy and EBUS Bronchoscopy  Indication(s) Diffuse bilateral infiltrates and concern for mediastinal and hilar LAD  Consent Risks of the procedure as well as the alternatives and risks of each were explained to the patient and/or caregiver.  Consent for the procedure was obtained.  Anesthesia General Anesthesia   Time Out Verified patient identification, verified procedure, site/side was marked, verified correct patient position, special equipment/implants available, medications/allergies/relevant history reviewed, required imaging and test results available.   Sterile Technique Usual hand hygiene, masks, gowns, and gloves were used   Procedure Description Diagnostic bronchoscope advanced through endotracheal tube and into airway.  Airways were examined down to subsegmental level with findings noted below.  Following diagnostic evaluation, BAL performed from superior segment of lingula. Forceps biopsies performed from the left lower lobe anterior segment under fluoroscopy guidance.  The diagnostic bronchoscope was then removed and the EBUS bronchoscope was advanced into airway with stations 11L and 7 biopsied and sent for cell block.  The EBUS bronchoscope was removed after assuring no active bleeding from biopsy site.  Findings: Airway exam and mucosa normal. Scant white secretions noted in the airway. BAL performed from lingula with 35cc fluid suctioned and sent for analysis. EBUS used to perform TBNA from stations 11L and 7   Complications/Tolerance None; patient tolerated the procedure well. Chest X-ray is needed post procedure.   EBL Minimal   Specimen(s) BAL from lingula Forceps biopsies from left lower lobe  EBUS with TBNA from stations 7 and 11L

## 2024-08-25 LAB — ACID FAST SMEAR (AFB, MYCOBACTERIA): Acid Fast Smear: NEGATIVE

## 2024-08-25 LAB — CULTURE, BAL-QUANTITATIVE W GRAM STAIN: Culture: NO GROWTH

## 2024-08-28 ENCOUNTER — Ambulatory Visit: Payer: Self-pay

## 2024-08-28 LAB — CYTOLOGY - NON PAP

## 2024-08-29 LAB — CYTOLOGY - NON PAP

## 2024-09-03 ENCOUNTER — Ambulatory Visit: Admitting: Acute Care

## 2024-09-03 ENCOUNTER — Encounter: Payer: Self-pay | Admitting: Acute Care

## 2024-09-03 VITALS — BP 138/82 | HR 71 | Temp 98.0°F | Ht 62.5 in | Wt 184.8 lb

## 2024-09-03 DIAGNOSIS — R9389 Abnormal findings on diagnostic imaging of other specified body structures: Secondary | ICD-10-CM

## 2024-09-03 DIAGNOSIS — R918 Other nonspecific abnormal finding of lung field: Secondary | ICD-10-CM

## 2024-09-03 DIAGNOSIS — R0609 Other forms of dyspnea: Secondary | ICD-10-CM | POA: Diagnosis not present

## 2024-09-03 DIAGNOSIS — J984 Other disorders of lung: Secondary | ICD-10-CM

## 2024-09-03 DIAGNOSIS — Z87891 Personal history of nicotine dependence: Secondary | ICD-10-CM

## 2024-09-03 NOTE — Patient Instructions (Addendum)
 It is good to see you today. I am glad you have done well after the biopsy. The biopsies did not show any malignancy. This is good news.  The pathologist also looked for granulomas , and he stated they were negative.  We will have you sign a release of information so we can get the labs from [facility] ( Dr. Strazanac) and PFT's from [facility]. We will set up a follow up appointment with Dr. Adrien as is scheduled 09/19/2024. She can review the rest of your autoimmune lab work . Call if you need us  sooner  Continue your albuterol  and Symbicort  equivalent as you have been doing.  Please contact office for sooner follow up if symptoms do not improve or worsen or seek emergency care

## 2024-09-03 NOTE — Progress Notes (Signed)
 History of Present Illness Mikayla Hart is a 64 y.o. female  woman shortness of breath and recurrent pneumonia. History of Sjogren's disease. Seen by Dr. Meade 08/08/2024.    09/03/2024 Discussed the use of AI scribe software for clinical note transcription with the patient, who gave verbal consent to proceed.  History of Present Illness Mikayla Hart is a 63 year old female with Sjogren's syndrome who presents for follow up after navigational bronchoscopy with biopsies on with shortness of breath and recent imaging findings. CT chest shows progressive upper and midlung zone predominant, stellate appearing, patchy ground-glass and consolidation, concerning for sarcoid, organizing pneumonia, or fibrosis.   She experienced shortness of breath and underwent imaging that led to a biopsy. Post-procedure, she had minor bleeding, spitting up blood once, which resolved spontaneously. She experienced no fever, discolored secretions, or  worsening shortness of breath. No issues with anesthesia.  We reviewed her biopsy results. They  showed no malignant cells or granulomas. She has a history of pneumonia that was difficult to resolve, initially diagnosed in May 2025, and treated with antibiotics. She was hospitalized in June 2025 and treated with IV antibiotics, Vancomycin and Cefepime, for four days.  She uses inhalers less frequently, specifically albuterol  and Breyna , every few days. She notes improvement in her breathing and has gained 20 pounds, attributing it to decreased activity and increased appetite possibly due to steroid inhaler use.PFT's show some mild restriction.   Her past medical history includes a previous episode of vomiting and gastrointestinal issues in January 2025, which affected her appetite. 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  Works in the   [employer]. We asked about possible exposures. She did states that the pipes back up frequently from the sewage and this has occurred many times. This did occur before she was sick in May 2025.   She has follow up with Dr. Adrien Guan 09/19/2024. Per the patient, she  has had a full lab work up for autoimmune disease. We will have her sign release paperwork for these labs as well as previously obtained PFT's to be released to [facility] for her follow up visit to continue to evaluate etiology of her   Test Results: Cytology 08/23/2024 A. LUNG, LUL, LAVAGE:    FINAL MICROSCOPIC DIAGNOSIS:  - No malignant cells identified  - Macrophages and mixed inflammation   B. LLL INFILTRATE, BIOPSY:  - Non-caseating epithelioid granulomatous inflammation.  - AFB and GMS stains will be reported in an addendum.  - Negative for malignancy.   C. LYMPH NODE, STATION 11L, FINE NEEDLE ASPIRATION:  - Negative for malignancy.  - Lymphoid tissue consistent with sampling of a lymph node.   D. LYMPH NODE, STATION 7, FINE NEEDLE ASPIRATION:  - Negative for malignancy.  - Lymphoid tissue consistent with sampling of a lymph node.   CT Chest 07/17/2024 Progressive upper and midlung zone predominant, stellate appearing, patchy ground-glass and consolidation, findings which can be seen with sarcoid. Organizing pneumonia is not excluded. Findings are suggestive of an alternative diagnosis (not UIP) per consensus guidelines:     Latest Ref Rng & Units 08/23/2024    9:55 AM 03/19/2024   11:59 AM 10/03/2023    2:30 PM  CBC  WBC 4.0 - 10.5 K/uL 6.7  8.2  7.5   Hemoglobin 12.0 - 15.0 g/dL 86.5  12.4  12.3   Hematocrit 36.0 - 46.0 % 43.6  39.4  40.8   Platelets 150 - 400 K/uL 330  407  240        Latest Ref Rng & Units 08/23/2024    9:55 AM 10/03/2023    2:30 PM 11/06/2020    9:00 PM  BMP  Glucose 70 - 99 mg/dL 91  91  854   BUN 8 - 23 mg/dL 14  13  9    Creatinine 0.44 - 1.00 mg/dL 9.18  9.18  9.18    BUN/Creat Ratio 12 - 28  16    Sodium 135 - 145 mmol/L 138  142  139   Potassium 3.5 - 5.1 mmol/L 3.4  3.6  4.0   Chloride 98 - 111 mmol/L 99  100  99   CO2 22 - 32 mmol/L 27  28  23    Calcium 8.9 - 10.3 mg/dL 89.7  89.9  89.4     BNP    Component Value Date/Time   BNP 11.3 03/19/2024 1159    ProBNP No results found for: PROBNP  PFT    Component Value Date/Time   FEV1PRE 1.78 07/30/2024 1530   FEV1POST 1.80 07/30/2024 1530   FVCPRE 2.04 07/30/2024 1530   FVCPOST 2.04 07/30/2024 1530   TLC 3.46 07/30/2024 1530   DLCOUNC 15.60 07/30/2024 1530   PREFEV1FVCRT 87 07/30/2024 1530   PSTFEV1FVCRT 88 07/30/2024 1530    DG Chest Port 1 View Result Date: 08/23/2024 CLINICAL DATA:  Status post 1 conspicuity. EXAM: PORTABLE CHEST 1 VIEW COMPARISON:  Chest radiograph dated 03/19/2024 chest CT dated 07/17/2024. FINDINGS: No pneumothorax post bronchoscopy. Bilateral pulmonary opacities, left greater than right and progressed since the prior radiograph but relatively similar to the CT of 07/17/2024. small left pleural effusion suspected. Stable cardiac silhouette. No acute osseous pathology. IMPRESSION: 1. No pneumothorax post bronchoscopy. 2. Bilateral pulmonary opacities, left greater than right. Electronically Signed   By: Vanetta Chou M.D.   On: 08/23/2024 13:39   DG C-ARM BRONCHOSCOPY Result Date: 08/23/2024 C-ARM BRONCHOSCOPY: Fluoroscopy was utilized by the requesting physician.  No radiographic interpretation.     Past medical hx Past Medical History:  Diagnosis Date   Acid reflux    Acute encephalopathy 06/01/2020   Acute metabolic encephalopathy 06/02/2020   Benign essential tremor 05/18/2019   Cervical pain 07/16/2019   Chronic upper abdominal pain 10/03/2023   Degenerative spondylolisthesis 08/13/2020   Added automatically from request for surgery [id number]     Dyspnea    Essential hypertension 06/02/2020   Gastroesophageal reflux disease without esophagitis  07/18/2019   Goiter 10/03/2023   Hypertension    Hypokalemia 06/02/2020   Hypomagnesemia 06/02/2020   Lumbar radiculopathy 01/10/2018   Microcytosis 05/18/2019   Microscopic hematuria 04/11/2023   Nausea and vomiting 10/03/2023   Non-intractable vomiting 06/02/2020   Osteoarthritis    Pneumonia    Pulmonary hypertension (HCC) 05/18/2019   Mild, RVSP 29.4 mmHg 2018 TTE     Risk factors for obstructive sleep apnea 05/22/2020   DO I SNORE SCORE of 5, sleep referral declined (05/22/2020)     S/P lumbar fusion 10/28/2020   Screen for colon cancer 10/03/2023   Situational mixed anxiety and depressive disorder 08/02/2018   Sjogren's disease    Spinal stenosis of lumbar region 07/16/2019   Vitamin D deficiency 02/27/2016     Social History   Tobacco Use   Smoking status: Former    Current packs/day: 0.00    Average  packs/day: 2.0 packs/day for 20.0 years (40.0 ttl pk-yrs)    Types: Cigarettes    Start date: 52    Quit date: 43    Years since quitting: 28.9    Passive exposure: Never   Smokeless tobacco: Never   Tobacco comments:    Started smoking at age 80. Quit cigarette in 1997 Smokes tobacco w/ marijuana 2017-2025.  Vaping Use   Vaping status: Never Used  Substance Use Topics   Alcohol use: Not Currently   Drug use: Not Currently    Comment: last smoked marijuana 01/2024    Ms.Dekay reports that she quit smoking about 28 years ago. Her smoking use included cigarettes. She started smoking about 48 years ago. She has a 40 pack-year smoking history. She has never been exposed to tobacco smoke. She has never used smokeless tobacco. She reports that she does not currently use alcohol. She reports that she does not currently use drugs.  Tobacco Cessation: Counseling given: Not Answered Tobacco comments: Started smoking at age 58. Quit cigarette in 1997 Smokes tobacco w/ marijuana 2017-2025. Former smoker, currently not smoking  Past surgical hx, Family hx, Social hx all  reviewed.  Current Outpatient Medications on File Prior to Visit  Medication Sig   acetaminophen  (TYLENOL ) 500 MG tablet Take 500 mg by mouth every evening.   albuterol  (VENTOLIN  HFA) 108 (90 Base) MCG/ACT inhaler Inhale 2 puffs into the lungs every 6 (six) hours as needed for wheezing or shortness of breath.   AUVI-Q  0.3 MG/0.3ML SOAJ injection Inject 0.3 mg into the muscle as needed for anaphylaxis.   budesonide -formoterol  (SYMBICORT ) 160-4.5 MCG/ACT inhaler Inhale 2 puffs into the lungs 2 (two) times daily.   celecoxib (CELEBREX) 200 MG capsule Take 200 mg by mouth 2 (two) times daily.   chlorthalidone  (HYGROTON ) 50 MG tablet Take 1 tablet (50 mg total) by mouth daily. (Patient taking differently: Take 50 mg by mouth See admin instructions. Take 1 tablet (50mg ) by mouth once daily, in the afternoon.)   Cholecalciferol (VITAMIN D-3 PO) Take 1 capsule by mouth daily in the afternoon.   famotidine  (PEPCID ) 20 MG tablet Take 1 tablet (20 mg total) by mouth 2 (two) times daily. (Patient taking differently: Take 20 mg by mouth 2 (two) times daily as needed (GERD).)   losartan  (COZAAR ) 100 MG tablet Take 1 tablet (100 mg total) by mouth daily. (Patient taking differently: Take 100 mg by mouth See admin instructions. Take 1 tablet (100mg ) by mouth once daily, in the afternoon.)   Multiple Vitamins-Minerals (CENTRUM SILVER 50+WOMEN) TABS Take 1 tablet by mouth daily in the afternoon.   potassium chloride  SA (KLOR-CON  M) 20 MEQ tablet Take 40 mEq by mouth daily in the afternoon.   No current facility-administered medications on file prior to visit.     Allergies  Allergen Reactions   Ms Contin [Morphine] Itching    Opioid-induced pruritus   Catapres-Tts [Clonidine] Other (See Comments)    Sedation   Norvasc  [Amlodipine ] Cough   Ultram [Tramadol] Nausea And Vomiting   Bee Venom Swelling    Knots on sting site   Sulfa Antibiotics Swelling    Sulfa eye drops only, eye swelling   Tetracyclines &  Related Other (See Comments)    Unknown reaction   Yellow Jacket Venom Swelling    Full upper arm swelling from sting on shoulder    Review Of Systems:  Constitutional:   No  weight loss, night sweats,  Fevers, chills, + fatigue, or  lassitude.  HEENT:   No headaches,  Difficulty swallowing,  Tooth/dental problems, or  Sore throat,                No sneezing, itching, ear ache, nasal congestion, post nasal drip,   CV:  No chest pain,  Orthopnea, PND, swelling in lower extremities, anasarca, dizziness, palpitations, syncope.   GI  No heartburn, indigestion, abdominal pain, nausea, vomiting, diarrhea, change in bowel habits, loss of appetite, bloody stools.   Resp: + shortness of breath with exertion less at rest.  No excess mucus, no productive cough,  No non-productive cough,  No coughing up of blood.  No change in color of mucus.  No wheezing.  No chest wall deformity  Skin: no rash or lesions.  GU: no dysuria, change in color of urine, no urgency or frequency.  No flank pain, no hematuria   MS:  No joint pain or swelling.  + decreased range of motion.  No back pain.  Psych:  No change in mood or affect. No depression or anxiety.  No memory loss.   Vital Signs BP 138/82   Pulse 71   Temp 98 F (36.7 C) (Oral)   Ht 5' 2.5 (1.588 m)   Wt 184 lb 12.8 oz (83.8 kg)   SpO2 98%   BMI 33.26 kg/m    Physical Exam GENERAL: No distress, alert and oriented times 3. EARS NOSE THROAT: No sinus tenderness, tympanic membranes clear, pale nasal mucosa, no oral exudate, no post nasal drip, no lymphadenopathy. CHEST: Crackles present in left mid zone. No wheeze, rales, dullness, no accessory muscle use, no nasal flaring, no sternal retractions. CARDIAC: S1, S2, regular rate and rhythm, no murmur. ABDOMINAL: Soft, non tender. ND, BS present, EXTREMITIES: No clubbing, cyanosis, edema. No obvious deformities. NEUROLOGICAL: Normal strength. Alert and oriented x 3, MAE x 4. SKIN: No rashes,  warm and dry. No obvious skin lesions. PSYCHIATRIC: Normal mood and behavior.   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 Residual changes post-pneumonia with crackles in the left mid-zone.  Current imaging shows some progression   Differential includes organizing pneumonia with potential scarring. - Continue albuterol  and Symbicort  equivalent inhalers as needed. - Obtain pulmonary function tests from [facility] for comparison. - Follow up with Doctor Adrien for further evaluation and management.  Autoimmune disease under evaluation (possible Sjogren's syndrome) Biopsy negative for malignancy and sarcoidosis.  Previous autoimmune panel inconclusive for Sjogren's syndrome. Further autoimmune markers needed. - Signed release of information to obtain previous autoimmune panel results from [facility]. - Follow up with Doctor Adrien for review of autoimmune panel and further testing if needed.  Shortness of breath Improved with reduced inhaler use. Symptoms managed with albuterol  and Symbicort  equivalent inhalers. - Continue current inhaler regimen as needed.  AVS 09/03/2024 The biopsies did not show any malignancy. This is good news.  The pathologist also looked for granulomas , and he stated they were negative.  We will have you sign a release of information so we can get the labs from [facility] ( Dr. Strazanac) and PFT's from [facility]. We will set up a follow up appointment with Dr. Adrien as is scheduled 09/19/2024. She can review the rest of your autoimmune lab work . Call if you need us  sooner  Continue your albuterol  and Symbicort  equivalent as you have been doing.  Please contact office for sooner follow up if  symptoms do not improve or worsen or seek emergency care     I spent 35 minutes dedicated to  the care of this patient on the date of this encounter to include pre-visit review of records, face-to-face time with the patient discussing conditions above, post visit ordering of testing, clinical documentation with the electronic health record, making appropriate referrals as documented, and communicating necessary information to the patient's healthcare team.     Lauraine JULIANNA Lites, NP 09/03/2024  10:50 AM

## 2024-09-06 ENCOUNTER — Telehealth: Payer: Self-pay | Admitting: Acute Care

## 2024-09-06 NOTE — Telephone Encounter (Signed)
 I have attempted to call the patient to discuss starting prednisone and Methotrexate. She did not answer. I have left contact information and asked her to return the call. We need LFT's , and once these have been drawn and reviewed we can send in the prescription. I will send in prednisone once I speak with her.  Please let me know if she returns the call. Thanks so much

## 2024-09-07 ENCOUNTER — Telehealth: Payer: Self-pay | Admitting: Acute Care

## 2024-09-07 ENCOUNTER — Other Ambulatory Visit: Payer: Self-pay

## 2024-09-07 DIAGNOSIS — R918 Other nonspecific abnormal finding of lung field: Secondary | ICD-10-CM

## 2024-09-07 NOTE — Telephone Encounter (Signed)
 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

## 2024-09-07 NOTE — Telephone Encounter (Signed)
 I called and spoke with Mikayla Hart. We discussed that the pulmonary nodules that were biopsied were most likely sarcoidosis, as they showed granulomas. However,  the lymph nodes did not look like sarcoidosis. We discussed starting prednisone 20 mg daily, and methotrexate 10 mg every week. We discussed that she would need LFT's prior to starting the methotrexate.  Pt. 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 Adrien  as is scheduled to discuss the pros and cons of initiating treatment. She has a sister who is has taken methotrexate in the past, and she would like to talk with her sister and Dr. Adrien before making a decision. She states she is not very symptomatic, and is unsure if she wants to start medication.

## 2024-09-18 NOTE — Progress Notes (Incomplete)
 New Patient Pulmonology Office Visit   Subjective:  Patient ID: Mikayla Hart, female    DOB: 1961-07-05  MRN: [id number]  Referred by: Iven Lang DASEN, PA-C  CC: No chief complaint on file.   HPI Mikayla Hart is a 63 y.o. female with possible early sjogrens syndrome (however by rheum, her labs are not consistent with Sjogren or lupus, it could be early presentation or fibromyalgia), with    {PULM QUESTIONNAIRES (Optional):33196}  ROS  Allergies: Ms contin [morphine], Catapres-tts [clonidine], Norvasc  [amlodipine ], Ultram [tramadol], Bee venom, Sulfa antibiotics, Tetracyclines & related, and Yellow jacket venom Current Medications[1] Past Medical History:  Diagnosis Date   Acid reflux    Acute encephalopathy 06/01/2020   Acute metabolic encephalopathy 06/02/2020   Benign essential tremor 05/18/2019   Cervical pain 07/16/2019   Chronic upper abdominal pain 10/03/2023   Degenerative spondylolisthesis 08/13/2020   Added automatically from request for surgery [id number]     Dyspnea    Essential hypertension 06/02/2020   Gastroesophageal reflux disease without esophagitis 07/18/2019   Goiter 10/03/2023   Hypertension    Hypokalemia 06/02/2020   Hypomagnesemia 06/02/2020   Lumbar radiculopathy 01/10/2018   Microcytosis 05/18/2019   Microscopic hematuria 04/11/2023   Nausea and vomiting 10/03/2023   Non-intractable vomiting 06/02/2020   Osteoarthritis    Pneumonia    Pulmonary hypertension (HCC) 05/18/2019   Mild, RVSP 29.4 mmHg 2018 TTE     Risk factors for obstructive sleep apnea 05/22/2020   DO I SNORE SCORE of 5, sleep referral declined (05/22/2020)     S/P lumbar fusion 10/28/2020   Screen for colon cancer 10/03/2023   Situational mixed anxiety and depressive disorder 08/02/2018   Sjogren's disease    Spinal stenosis of lumbar region 07/16/2019   Vitamin D deficiency 02/27/2016   Past Surgical History:  Procedure Laterality Date   ABDOMINAL HYSTERECTOMY      BACK SURGERY     2   BUNIONECTOMY     CESAREAN SECTION     3   CYSTECTOMY  1972   left leg   HAMMER TOE SURGERY     SALPINGECTOMY     2010   UMBILICAL HERNIA REPAIR  1973   VIDEO BRONCHOSCOPY Bilateral 08/23/2024   Procedure: BRONCHOSCOPY, WITH FLUOROSCOPY;  Surgeon: Zaida Zola SAILOR, MD;  Location: [facility];  Service: Pulmonary;  Laterality: Bilateral;   VIDEO BRONCHOSCOPY WITH ENDOBRONCHIAL ULTRASOUND Bilateral 08/23/2024   Procedure: BRONCHOSCOPY, WITH EBUS;  Surgeon: Zaida Zola SAILOR, MD;  Location: [facility] ENDOSCOPY;  Service: Pulmonary;  Laterality: Bilateral;   Family History  Problem Relation Age of Onset   Hypertension Mother    Heart disease Mother    Stroke Mother        died of   Diabetes type II Mother    Glaucoma Father    Hypercholesterolemia Father    Rheum arthritis Sister    Psoriasis Sister    Social History   Socioeconomic History   Marital status: Married    Spouse name: Not on file   Number of children: 5   Years of education: Not on file   Highest education level: Not on file  Occupational History   Occupation: [employer]    Comment: LPN   Occupation: public relations account executive  Tobacco Use   Smoking status: Former    Current packs/day: 0.00    Average packs/day: 2.0 packs/day for 20.0 years (40.0 ttl pk-yrs)    Types: Cigarettes    Start date: 61  Quit date: 49    Years since quitting: 28.9    Passive exposure: Never   Smokeless tobacco: Never   Tobacco comments:    Started smoking at age 20. Quit cigarette in 1997 Smokes tobacco w/ marijuana 2017-2025.  Vaping Use   Vaping status: Never Used  Substance and Sexual Activity   Alcohol use: Not Currently   Drug use: Not Currently    Comment: last smoked marijuana 01/2024   Sexual activity: Not on file  Other Topics Concern   Not on file  Social History Narrative   Not on file   Social Drivers of Health   Tobacco Use: Medium Risk (09/03/2024)    Patient History    Smoking Tobacco Use: Former    Smokeless Tobacco Use: Never    Passive Exposure: Never  Physicist, Medical Strain: Not on file  Food Insecurity: No Food Insecurity (12/07/2023)   Hunger Vital Sign    Worried About Running Out of Food in the Last Year: Never true    Ran Out of Food in the Last Year: Never true  Transportation Needs: No Transportation Needs (12/07/2023)   PRAPARE - Administrator, Civil Service (Medical): No    Lack of Transportation (Non-Medical): No  Physical Activity: Not on file  Stress: Not on file  Social Connections: Unknown (02/12/2022)   Received from [facility]   Social Network    Social Network: Not on file  Intimate Partner Violence: Not At Risk (12/07/2023)   Humiliation, Afraid, Rape, and Kick questionnaire    Fear of Current or Ex-Partner: No    Emotionally Abused: No    Physically Abused: No    Sexually Abused: No  Depression (PHQ2-9): Low Risk (03/09/2024)   Depression (PHQ2-9)    PHQ-2 Score: 0  Alcohol Screen: Not on file  Housing: Unknown (12/07/2023)   Housing Stability Vital Sign    Unable to Pay for Housing in the Last Year: No    Number of Times Moved in the Last Year: Not on file    Homeless in the Last Year: No  Utilities: Not At Risk (12/07/2023)   [employer] Utilities    Threatened with loss of utilities: No  Health Literacy: Not on file    Sister - RA (sibling) Multiple siblings with plaque psoriasis. (Half siblings) Daughter - Sjogren's Niece - sister's daughter had sarcoidosis. She died from this.  No personal history of cancer.    Objective:  There were no vitals taken for this visit. {Pulm Vitals (Optional):32837}  Physical Exam  Diagnostic Review:  {Labs (Optional):32838} 11/09/23 for initial visit. Labs completed showed negative/normal blood counts, kidney and liver function, UA, SPEP, ANCA panel, UPEP, vitamin B12 and folate, CK, RPR, HIV, free kappa light chains, free lambda light chains.    AVISE  CTD panel showed  ANA IgG 26.98 (+20-60, strong+>60), and negative/normal  ANA by HEP2, dsDNA, Smith, EC4d, BC4d, SSB,  SCL 70, centromere protein B, Jo 1, CCP, TC4d, T IgG, T IgM, anti-RA 33 IgG/M/A, rheumatoid factor IgM/A, SSA Ro 52, SSA Ro 60, U1 RNP RNP 70, cardiolipin IgM/G, beta-2 glycoprotein 1 IgM/G, thyroglobulin IgG, thyroid peroxidase IgG.   CT chest 11/07/2020 No pulmonary embolus or other acute thoracic abnormality. No lung parenchyma abnormalities  CT chest 11/2023 Patchy nodular infiltration in the LUL, lingula, LLL. Bilateral hilar adenopathy.   CT chest 07/17/2024 Progressive upper and midlung zone predominant, stellate appearing, patchy ground-glass and consolidation, findings which can be seen with sarcoid. Organizing pneumonia  is not excluded. Findings are suggestive of an alternative diagnosis (not UIP) per consensus guidelines: Diagnosis of Idiopathic Pulmonary Fibrosis. Progressed from CT chest on 11/2023  08/10/2024 Navigational bronchoscopy EBUS 11L: neg for malignancy, lymphoid tissue with sampling of a LN. 7: neg for malignancy, lymphoid tissue with sampling of a LN. LLL: non-caseating granulomatous inflammation. Neg for malignancy. AFB negative. Neg BAL cx. Fungal pending.  PFTs  07/30/24 FVC     2/-2.6/65%  FEV1   1.78/-1.74/74% F/F       87% TLC      3.4/-2.7/70% RV  1.3/-1.8/65% DLCO   15.6/-1.26/80% 07/30/24: Moderate restriction, normal DLCO, no obstruction. No BD response.      Assessment & Plan:   Assessment & Plan Dyspnea on exertion     Shortness of breath     Sarcoidosis Prednisone taper  40 mg x 2 w 30mg  x 2w 20 mg x2 w 10 mg x2 w 5mg  x 1 w  Methotraxate  Initial: 5 to 7.5 mg once weekly (in combination with folic acid). Increase dose gradually (eg, by 2.5 mg/week every 2 weeks) if needed up to 20 mg/week; usual dosage range: 10 to 15 mg/week  Cbc, cmp, hepatitis screening, quatinferon and HIV Labs  7.5 mg and start 2.5 mg  up   No follow-ups on file.    Marny Patch, MD Pulmonary and Critical Care Medicine [facility]    [1]  Current Outpatient Medications:    acetaminophen  (TYLENOL ) 500 MG tablet, Take 500 mg by mouth every evening., Disp: , Rfl:    albuterol  (VENTOLIN  HFA) 108 (90 Base) MCG/ACT inhaler, Inhale 2 puffs into the lungs every 6 (six) hours as needed for wheezing or shortness of breath., Disp: 8 g, Rfl: 0   AUVI-Q  0.3 MG/0.3ML SOAJ injection, Inject 0.3 mg into the muscle as needed for anaphylaxis., Disp: 1 each, Rfl: 3   budesonide -formoterol  (SYMBICORT ) 160-4.5 MCG/ACT inhaler, Inhale 2 puffs into the lungs 2 (two) times daily., Disp: 10.2 g, Rfl: 3   celecoxib (CELEBREX) 200 MG capsule, Take 200 mg by mouth 2 (two) times daily., Disp: , Rfl:    chlorthalidone  (HYGROTON ) 50 MG tablet, Take 1 tablet (50 mg total) by mouth daily. (Patient taking differently: Take 50 mg by mouth See admin instructions. Take 1 tablet (50mg ) by mouth once daily, in the afternoon.), Disp: 90 tablet, Rfl: 1   Cholecalciferol (VITAMIN D-3 PO), Take 1 capsule by mouth daily in the afternoon., Disp: , Rfl:    famotidine  (PEPCID ) 20 MG tablet, Take 1 tablet (20 mg total) by mouth 2 (two) times daily. (Patient taking differently: Take 20 mg by mouth 2 (two) times daily as needed (GERD).), Disp: 60 tablet, Rfl: 1   losartan  (COZAAR ) 100 MG tablet, Take 1 tablet (100 mg total) by mouth daily. (Patient taking differently: Take 100 mg by mouth See admin instructions. Take 1 tablet (100mg ) by mouth once daily, in the afternoon.), Disp: 90 tablet, Rfl: 3   Multiple Vitamins-Minerals (CENTRUM SILVER 50+WOMEN) TABS, Take 1 tablet by mouth daily in the afternoon., Disp: , Rfl:    potassium chloride  SA (KLOR-CON  M) 20 MEQ tablet, Take 40 mEq by mouth daily in the afternoon., Disp: , Rfl:

## 2024-09-19 ENCOUNTER — Other Ambulatory Visit: Payer: Self-pay

## 2024-09-19 ENCOUNTER — Ambulatory Visit

## 2024-09-19 VITALS — BP 133/84 | HR 85 | Temp 98.2°F | Ht 62.5 in | Wt 185.6 lb

## 2024-09-19 DIAGNOSIS — R0609 Other forms of dyspnea: Secondary | ICD-10-CM

## 2024-09-19 DIAGNOSIS — D860 Sarcoidosis of lung: Secondary | ICD-10-CM | POA: Diagnosis not present

## 2024-09-19 DIAGNOSIS — J984 Other disorders of lung: Secondary | ICD-10-CM

## 2024-09-19 DIAGNOSIS — D869 Sarcoidosis, unspecified: Secondary | ICD-10-CM

## 2024-09-19 DIAGNOSIS — R0602 Shortness of breath: Secondary | ICD-10-CM

## 2024-09-19 LAB — CBC WITH DIFFERENTIAL/PLATELET
Basophils Absolute: 0 K/uL (ref 0.0–0.1)
Basophils Relative: 0.9 % (ref 0.0–3.0)
Eosinophils Absolute: 0.2 K/uL (ref 0.0–0.7)
Eosinophils Relative: 3.9 % (ref 0.0–5.0)
HCT: 39 % (ref 36.0–46.0)
Hemoglobin: 12.3 g/dL (ref 12.0–15.0)
Lymphocytes Relative: 46.2 % — ABNORMAL HIGH (ref 12.0–46.0)
Lymphs Abs: 2.4 K/uL (ref 0.7–4.0)
MCHC: 31.6 g/dL (ref 30.0–36.0)
MCV: 72.2 fl — ABNORMAL LOW (ref 78.0–100.0)
Monocytes Absolute: 0.5 K/uL (ref 0.1–1.0)
Monocytes Relative: 10.3 % (ref 3.0–12.0)
Neutro Abs: 2 K/uL (ref 1.4–7.7)
Neutrophils Relative %: 38.7 % — ABNORMAL LOW (ref 43.0–77.0)
Platelets: 309 K/uL (ref 150.0–400.0)
RBC: 5.39 Mil/uL — ABNORMAL HIGH (ref 3.87–5.11)
RDW: 15.4 % (ref 11.5–15.5)
WBC: 5.2 K/uL (ref 4.0–10.5)

## 2024-09-19 MED ORDER — PREDNISONE 10 MG PO TABS: ORAL_TABLET | ORAL | 0 refills | Status: DC

## 2024-09-19 NOTE — Patient Instructions (Addendum)
 Dear Ms. Gladis;  Given your biopsy results, you have sarcoidosis and I will recommend the following: -Prednisone 20 mg for 15 days, then 10 mg for 7 days and then 5 mg for 5 days. -I will start methotrexate as well to achieve the dose of 15 mg to treat the sarcoidosis. Below are the instructions.   Methotrexate Instructions: We will slowly increase the dose of methotrexate to avoid side effects. You will need frequent blood work to monitor for side effects at first. The goal dose will be 6 tablets (15 mg) weekly.  **Remember this is a WEEKLY, not a daily, medication! Take it the same day (e.g. Sunday) every week.   - Start methotrexate at 7.5 mg (3 tablets) weekly; then increase to 10 mg (4 tablets) in the second week, then have labs checked - Increase dose by 2.5 mg (1 tablet) each week until at a goal dose of 15 mg (6 tablets) weekly - Have your safety labs checked every two weeks until reaching goal dose  - Take folic acid daily to reduce risk of bone marrow suppression   - Risks and side effects of methotrexate were discussed, including gastrointestinal upset, increased risk of infection, liver and lung injury, and suppression of bone marrow.  - Labs will be needed every 3-6 months once on a stable goal dose of the medication. - Please, send us  a MyChart message after your blood work is drawn so we can follow up and let you know if any dose change is required.

## 2024-09-21 ENCOUNTER — Telehealth: Payer: Self-pay

## 2024-09-21 DIAGNOSIS — Z79899 Other long term (current) drug therapy: Secondary | ICD-10-CM

## 2024-09-21 DIAGNOSIS — D869 Sarcoidosis, unspecified: Secondary | ICD-10-CM

## 2024-09-21 LAB — FUNGAL ORGANISM REFLEX

## 2024-09-21 LAB — FUNGUS CULTURE WITH STAIN

## 2024-09-21 LAB — FUNGUS CULTURE RESULT

## 2024-09-21 NOTE — Telephone Encounter (Signed)
 Received referral from Dr. Adrien Guan for new start methotrexate.   Methotrexate. To start with 7.5 mg and up titrate to 15 mg for sarcoidosis.  Baseline labs pending. When resulted, will contact patient for new start methotrexate.

## 2024-09-22 LAB — COMPLETE METABOLIC PANEL WITHOUT GFR
AG Ratio: 1.1 (calc) (ref 1.0–2.5)
ALT: 13 U/L (ref 6–29)
AST: 20 U/L (ref 10–35)
Albumin: 4.3 g/dL (ref 3.6–5.1)
Alkaline phosphatase (APISO): 114 U/L (ref 37–153)
BUN: 21 mg/dL (ref 7–25)
CO2: 30 mmol/L (ref 20–32)
Calcium: 11.2 mg/dL — ABNORMAL HIGH (ref 8.6–10.4)
Chloride: 100 mmol/L (ref 98–110)
Creat: 0.89 mg/dL (ref 0.50–1.05)
Globulin: 3.8 g/dL — ABNORMAL HIGH (ref 1.9–3.7)
Glucose, Bld: 92 mg/dL (ref 65–99)
Potassium: 4.2 mmol/L (ref 3.5–5.3)
Sodium: 140 mmol/L (ref 135–146)
Total Bilirubin: 0.5 mg/dL (ref 0.2–1.2)
Total Protein: 8.1 g/dL (ref 6.1–8.1)

## 2024-09-22 LAB — HEPATITIS PANEL, ACUTE
Hep A IgM: NONREACTIVE
Hep B C IgM: NONREACTIVE
Hepatitis B Surface Ag: NONREACTIVE
Hepatitis C Ab: NONREACTIVE

## 2024-09-22 LAB — HIV ANTIBODY (ROUTINE TESTING W REFLEX)
HIV 1&2 Ab, 4th Generation: NONREACTIVE
HIV FINAL INTERPRETATION: NEGATIVE

## 2024-09-23 LAB — QUANTIFERON-TB GOLD PLUS
Mitogen-NIL: >10 [IU]/mL
NIL: 0.02 [IU]/mL
QuantiFERON-TB Gold Plus: NEGATIVE
TB1-NIL: 0.01 [IU]/mL
TB2-NIL: 0.01 [IU]/mL

## 2024-09-24 ENCOUNTER — Ambulatory Visit: Payer: Self-pay

## 2024-09-25 ENCOUNTER — Telehealth: Payer: Self-pay

## 2024-09-25 MED ORDER — FOLIC ACID 1 MG PO TABS: 1.0000 mg | ORAL_TABLET | Freq: Every day | ORAL | 5 refills | Status: AC

## 2024-09-25 MED ORDER — METHOTREXATE SODIUM 2.5 MG PO TABS: ORAL_TABLET | ORAL | 0 refills | Status: AC

## 2024-09-25 NOTE — Telephone Encounter (Signed)
 Second ATC patient - able to speak with patient via telephone.   Patient was counseled on the purpose, proper use, and adverse effects of methotrexate  including nausea and infection. She reports she very thoroughly discussed with Dr. 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  Instructed patient that medication should be held for infection.  Reviewed instructions with patient to take methotrexate  weekly along with folic acid  daily to prevent side effects.  Discussed the importance of frequent monitoring of kidney and liver function and blood counts, and provided patient with lab instructions.    Sending MyChart message with highlights of counseling information - avoid NSAIDs and alcohol while on methotrexate . Encouraged and answered all questions.  Patient voiced understanding.  Patient verbally consented to methotrexate  use.    Rx for methotrexate  and folic acid  sent to pharmacy.  CBC/CMP standing orders placed for [facility]. Requested patient message me in MyChart ahead of when she goes so I can ensure they are released and visible.

## 2024-09-25 NOTE — Telephone Encounter (Signed)
 Already has been address     Copied from CRM #[id number]. Topic: Clinical - Prescription Issue >> Sep 25, 2024  2:52 PM Mikayla Hart wrote: Reason for CRM: Tammy with [facility] pharmacy called about this medicine predniSONE (DELTASONE) 10 MG the pt called to question why it is only 20 tablets. The pharmacy needs the correct amount and direction

## 2024-09-25 NOTE — Telephone Encounter (Signed)
 Attempted to call patient - Left HIPAA compliant VM with my direct office line requesting return call.   This is a holiday week, so if I do not hear from patient today, will send MyChart message with dosing instructions as below and will send Rx to avoid delay in care.    Week Methotrexate  Dose (weekly) Folic acid  dose (daily) Labs due  1-2 7.5mg  1g End of Week 2  3-4 10mg  1g End of Week 4  5-6 12.5mg  1g End of Week 6  7-8 15mg  1g End of Week 8  9 and thereafter  15mg  1g Every 3 months    Will await patients return call today.

## 2024-10-02 ENCOUNTER — Other Ambulatory Visit: Payer: Self-pay

## 2024-10-02 MED ORDER — PREDNISONE 10 MG PO TABS: ORAL_TABLET | ORAL | 0 refills | Status: AC

## 2024-10-02 NOTE — Telephone Encounter (Signed)
 I refilled prednisone dose for sarcoidosis. For 30 days. 20 mg for 15 days then 10 mg for 10 days and 5 mg for 5 days. Marny Mellow, MD

## 2024-10-02 NOTE — Telephone Encounter (Signed)
 Copied from CRM [id number]. Topic: Clinical - Prescription Issue >> Oct 02, 2024 10:40 AM Devaughn RAMAN wrote: Reason for CRM: Stacy with [facility] called regarding a prednisone rx for the pt, Glade stated the prescription was sent in on last week but it is not enough tablets so she is out, contacted [facility] no answer. Please f/u with [facility]. >> Oct 02, 2024 10:46 AM Ismael LABOR wrote: Glade called back since call dropped, she states pt is wondering if she needs to start back over on the prednisone tx since she has been out since this Sunday 09/30/24  Called and spoke with Glade at [facility] regarding prednisone sent 12/17. The quantity sent was 20 which was not enough to last per the directions given.  Pt finished prednisone on Saturday but has not been taking it a full 15 days and ran out due to wrong amount of quantity.  Pt would like to know if she needs to restart full prednisone directions or start with how many days she had left to take 20mg .  Dr. Adrien please advise directions about prednisone

## 2024-10-03 NOTE — Telephone Encounter (Signed)
 ATC x1. Left detailed vm stating prednisone sent to pharmacy and to take as directed on label. Nothing further needed.

## 2024-10-04 ENCOUNTER — Other Ambulatory Visit (HOSPITAL_BASED_OUTPATIENT_CLINIC_OR_DEPARTMENT_OTHER): Payer: Self-pay | Admitting: Student

## 2024-10-06 LAB — ACID FAST CULTURE WITH REFLEXED SENSITIVITIES (MYCOBACTERIA): Acid Fast Culture: NEGATIVE

## 2024-10-10 ENCOUNTER — Other Ambulatory Visit (HOSPITAL_BASED_OUTPATIENT_CLINIC_OR_DEPARTMENT_OTHER)

## 2024-10-10 ENCOUNTER — Encounter (HOSPITAL_BASED_OUTPATIENT_CLINIC_OR_DEPARTMENT_OTHER): Payer: Self-pay

## 2024-10-11 ENCOUNTER — Ambulatory Visit: Payer: Self-pay

## 2024-10-11 LAB — CBC WITH DIFFERENTIAL/PLATELET
Basophils Absolute: 0 x10E3/uL (ref 0.0–0.2)
Basos: 1 %
EOS (ABSOLUTE): 0.1 x10E3/uL (ref 0.0–0.4)
Eos: 2 %
Hematocrit: 42.1 % (ref 34.0–46.6)
Hemoglobin: 12.7 g/dL (ref 11.1–15.9)
Immature Grans (Abs): 0 x10E3/uL (ref 0.0–0.1)
Immature Granulocytes: 0 %
Lymphocytes Absolute: 4.4 x10E3/uL — ABNORMAL HIGH (ref 0.7–3.1)
Lymphs: 55 %
MCH: 23.2 pg — ABNORMAL LOW (ref 26.6–33.0)
MCHC: 30.2 g/dL — ABNORMAL LOW (ref 31.5–35.7)
MCV: 77 fL — ABNORMAL LOW (ref 79–97)
Monocytes Absolute: 0.6 x10E3/uL (ref 0.1–0.9)
Monocytes: 7 %
Neutrophils Absolute: 2.6 x10E3/uL (ref 1.4–7.0)
Neutrophils: 34 %
Platelets: 317 x10E3/uL (ref 150–450)
RBC: 5.48 x10E6/uL — ABNORMAL HIGH (ref 3.77–5.28)
RDW: 15.6 % — ABNORMAL HIGH (ref 11.7–15.4)
WBC: 7.7 x10E3/uL (ref 3.4–10.8)

## 2024-10-11 LAB — COMPREHENSIVE METABOLIC PANEL WITH GFR
ALT: 20 IU/L (ref 0–32)
AST: 20 IU/L (ref 0–40)
Albumin: 4.3 g/dL (ref 3.9–4.9)
Alkaline Phosphatase: 106 IU/L (ref 49–135)
BUN/Creatinine Ratio: 21 (ref 12–28)
BUN: 20 mg/dL (ref 8–27)
Bilirubin Total: 0.3 mg/dL (ref 0.0–1.2)
CO2: 29 mmol/L (ref 20–29)
Calcium: 10.8 mg/dL — ABNORMAL HIGH (ref 8.7–10.3)
Chloride: 97 mmol/L (ref 96–106)
Creatinine, Ser: 0.97 mg/dL (ref 0.57–1.00)
Globulin, Total: 3.3 g/dL (ref 1.5–4.5)
Glucose: 89 mg/dL (ref 70–99)
Potassium: 3.4 mmol/L — ABNORMAL LOW (ref 3.5–5.2)
Sodium: 139 mmol/L (ref 134–144)
Total Protein: 7.6 g/dL (ref 6.0–8.5)
eGFR: 66 mL/min/1.73

## 2024-10-14 ENCOUNTER — Other Ambulatory Visit (HOSPITAL_BASED_OUTPATIENT_CLINIC_OR_DEPARTMENT_OTHER): Payer: Self-pay | Admitting: Student

## 2024-10-22 NOTE — Telephone Encounter (Signed)
 Released CBC/CMP for [facility] today. Labs due this week.

## 2024-10-23 ENCOUNTER — Ambulatory Visit

## 2024-10-23 DIAGNOSIS — I351 Nonrheumatic aortic (valve) insufficiency: Secondary | ICD-10-CM | POA: Diagnosis not present

## 2024-10-23 DIAGNOSIS — D869 Sarcoidosis, unspecified: Secondary | ICD-10-CM | POA: Diagnosis not present

## 2024-10-23 LAB — ECHOCARDIOGRAM COMPLETE
AR max vel: 3.62 cm2
AV Area VTI: 3.41 cm2
AV Area mean vel: 3.4 cm2
AV Mean grad: 3 mmHg
AV Peak grad: 6.3 mmHg
Ao pk vel: 1.25 m/s
Area-P 1/2: 3.1 cm2
MV VTI: 2.29 cm2
S' Lateral: 3.1 cm

## 2024-10-27 LAB — COMPREHENSIVE METABOLIC PANEL WITH GFR
ALT: 21 [IU]/L (ref 0–32)
AST: 18 [IU]/L (ref 0–40)
Albumin: 4.1 g/dL (ref 3.9–4.9)
Alkaline Phosphatase: 126 [IU]/L (ref 49–135)
BUN/Creatinine Ratio: 23 (ref 12–28)
BUN: 27 mg/dL (ref 8–27)
Bilirubin Total: 0.3 mg/dL (ref 0.0–1.2)
CO2: 29 mmol/L (ref 20–29)
Calcium: 9.7 mg/dL (ref 8.7–10.3)
Chloride: 99 mmol/L (ref 96–106)
Creatinine, Ser: 1.19 mg/dL — ABNORMAL HIGH (ref 0.57–1.00)
Globulin, Total: 2.7 g/dL (ref 1.5–4.5)
Glucose: 87 mg/dL (ref 70–99)
Potassium: 3.4 mmol/L — ABNORMAL LOW (ref 3.5–5.2)
Sodium: 141 mmol/L (ref 134–144)
Total Protein: 6.8 g/dL (ref 6.0–8.5)
eGFR: 51 mL/min/{1.73_m2} — ABNORMAL LOW

## 2024-10-27 LAB — CBC WITH DIFFERENTIAL/PLATELET
Basophils Absolute: 0 10*3/uL (ref 0.0–0.2)
Basos: 1 %
EOS (ABSOLUTE): 0.1 10*3/uL (ref 0.0–0.4)
Eos: 1 %
Hematocrit: 40.4 % (ref 34.0–46.6)
Hemoglobin: 12.1 g/dL (ref 11.1–15.9)
Immature Grans (Abs): 0.1 10*3/uL (ref 0.0–0.1)
Immature Granulocytes: 1 %
Lymphocytes Absolute: 4.4 10*3/uL — ABNORMAL HIGH (ref 0.7–3.1)
Lymphs: 50 %
MCH: 23.4 pg — ABNORMAL LOW (ref 26.6–33.0)
MCHC: 30 g/dL — ABNORMAL LOW (ref 31.5–35.7)
MCV: 78 fL — ABNORMAL LOW (ref 79–97)
Monocytes Absolute: 0.7 10*3/uL (ref 0.1–0.9)
Monocytes: 8 %
Neutrophils Absolute: 3.4 10*3/uL (ref 1.4–7.0)
Neutrophils: 39 %
Platelets: 311 10*3/uL (ref 150–450)
RBC: 5.16 x10E6/uL (ref 3.77–5.28)
RDW: 16.3 % — ABNORMAL HIGH (ref 11.7–15.4)
WBC: 8.7 10*3/uL (ref 3.4–10.8)

## 2024-10-29 ENCOUNTER — Telehealth: Payer: Self-pay

## 2024-10-29 NOTE — Telephone Encounter (Signed)
 CBC/CMP resulted 10/26/24. Results overall stable from previous.  Next labs due in 2 weeks. Plan to obtain at [facility] in [place].

## 2024-11-07 ENCOUNTER — Ambulatory Visit

## 2024-11-07 ENCOUNTER — Other Ambulatory Visit: Payer: Self-pay

## 2024-11-07 DIAGNOSIS — D869 Sarcoidosis, unspecified: Secondary | ICD-10-CM

## 2024-11-07 DIAGNOSIS — Z79899 Other long term (current) drug therapy: Secondary | ICD-10-CM

## 2024-11-07 LAB — PULMONARY FUNCTION TEST
DL/VA % pred: 101 %
DL/VA: 4.29 ml/min/mmHg/L
DLCO cor % pred: 78 %
DLCO cor: 15.17 ml/min/mmHg
DLCO unc % pred: 75 %
DLCO unc: 14.53 ml/min/mmHg
FEF 25-75 Post: 2.92 L/s
FEF 25-75 Pre: 2.42 L/s
FEF2575-%Change-Post: 20 %
FEF2575-%Pred-Post: 135 %
FEF2575-%Pred-Pre: 112 %
FEV1-%Change-Post: 4 %
FEV1-%Pred-Post: 88 %
FEV1-%Pred-Pre: 84 %
FEV1-Post: 2.11 L
FEV1-Pre: 2.02 L
FEV1FVC-%Change-Post: 3 %
FEV1FVC-%Pred-Pre: 109 %
FEV6-%Change-Post: 1 %
FEV6-%Pred-Post: 80 %
FEV6-%Pred-Pre: 79 %
FEV6-Post: 2.4 L
FEV6-Pre: 2.37 L
FEV6FVC-%Pred-Post: 103 %
FEV6FVC-%Pred-Pre: 103 %
FVC-%Change-Post: 1 %
FVC-%Pred-Post: 77 %
FVC-%Pred-Pre: 76 %
FVC-Post: 2.4 L
FVC-Pre: 2.37 L
Post FEV1/FVC ratio: 88 %
Post FEV6/FVC ratio: 100 %
Pre FEV1/FVC ratio: 85 %
Pre FEV6/FVC Ratio: 100 %
RV % pred: 93 %
RV: 1.88 L
TLC % pred: 87 %
TLC: 4.3 L

## 2024-11-07 NOTE — Progress Notes (Addendum)
 Full pft performed today

## 2024-11-07 NOTE — Patient Instructions (Signed)
 Full pft performed today

## 2024-11-14 ENCOUNTER — Ambulatory Visit

## 2024-12-06 ENCOUNTER — Ambulatory Visit (HOSPITAL_BASED_OUTPATIENT_CLINIC_OR_DEPARTMENT_OTHER): Admitting: Student
